# Patient Record
Sex: Male | Born: 1955
Health system: Southern US, Community
[De-identification: ages and names within clinical notes are randomized; demographics above are authoritative.]

## PROBLEM LIST (undated history)

## (undated) DIAGNOSIS — J342 Deviated nasal septum: Secondary | ICD-10-CM

## (undated) DIAGNOSIS — M5030 Other cervical disc degeneration, unspecified cervical region: Secondary | ICD-10-CM

## (undated) DIAGNOSIS — G4730 Sleep apnea, unspecified: Secondary | ICD-10-CM

## (undated) DIAGNOSIS — F419 Anxiety disorder, unspecified: Secondary | ICD-10-CM

## (undated) DIAGNOSIS — M5136 Other intervertebral disc degeneration, lumbar region: Secondary | ICD-10-CM

## (undated) DIAGNOSIS — C61 Malignant neoplasm of prostate: Secondary | ICD-10-CM

## (undated) DIAGNOSIS — E119 Type 2 diabetes mellitus without complications: Secondary | ICD-10-CM

## (undated) DIAGNOSIS — J45909 Unspecified asthma, uncomplicated: Secondary | ICD-10-CM

## (undated) DIAGNOSIS — E7800 Pure hypercholesterolemia, unspecified: Secondary | ICD-10-CM

## (undated) DIAGNOSIS — J449 Chronic obstructive pulmonary disease, unspecified: Secondary | ICD-10-CM

## (undated) DIAGNOSIS — N400 Enlarged prostate without lower urinary tract symptoms: Secondary | ICD-10-CM

## (undated) DIAGNOSIS — K219 Gastro-esophageal reflux disease without esophagitis: Secondary | ICD-10-CM

## (undated) DIAGNOSIS — F329 Major depressive disorder, single episode, unspecified: Secondary | ICD-10-CM

## (undated) DIAGNOSIS — F32A Depression, unspecified: Secondary | ICD-10-CM

## (undated) DIAGNOSIS — M51369 Other intervertebral disc degeneration, lumbar region without mention of lumbar back pain or lower extremity pain: Secondary | ICD-10-CM

## (undated) DIAGNOSIS — S92309A Fracture of unspecified metatarsal bone(s), unspecified foot, initial encounter for closed fracture: Secondary | ICD-10-CM

## (undated) DIAGNOSIS — H269 Unspecified cataract: Secondary | ICD-10-CM

## (undated) DIAGNOSIS — I10 Essential (primary) hypertension: Secondary | ICD-10-CM

## (undated) HISTORY — DX: Anxiety disorder, unspecified: F41.9

## (undated) HISTORY — DX: Sleep apnea, unspecified: G47.30

---

## 2003-09-25 HISTORY — PX: CERVICAL FUSION: SHX112

## 2004-05-11 IMAGING — CR DG CHEST 2V
2 series · 2 of 2 positions shown · non-contrast
Comparison: None.

CLINICAL DATA: Cervical foraminal stenosis; preoperative respiratory evaluation prior to decompression.  Long-time smoker with history of asthma. 
 CHEST, TWO VIEWS 05/11/04

[view not recorded (1 of 2)]
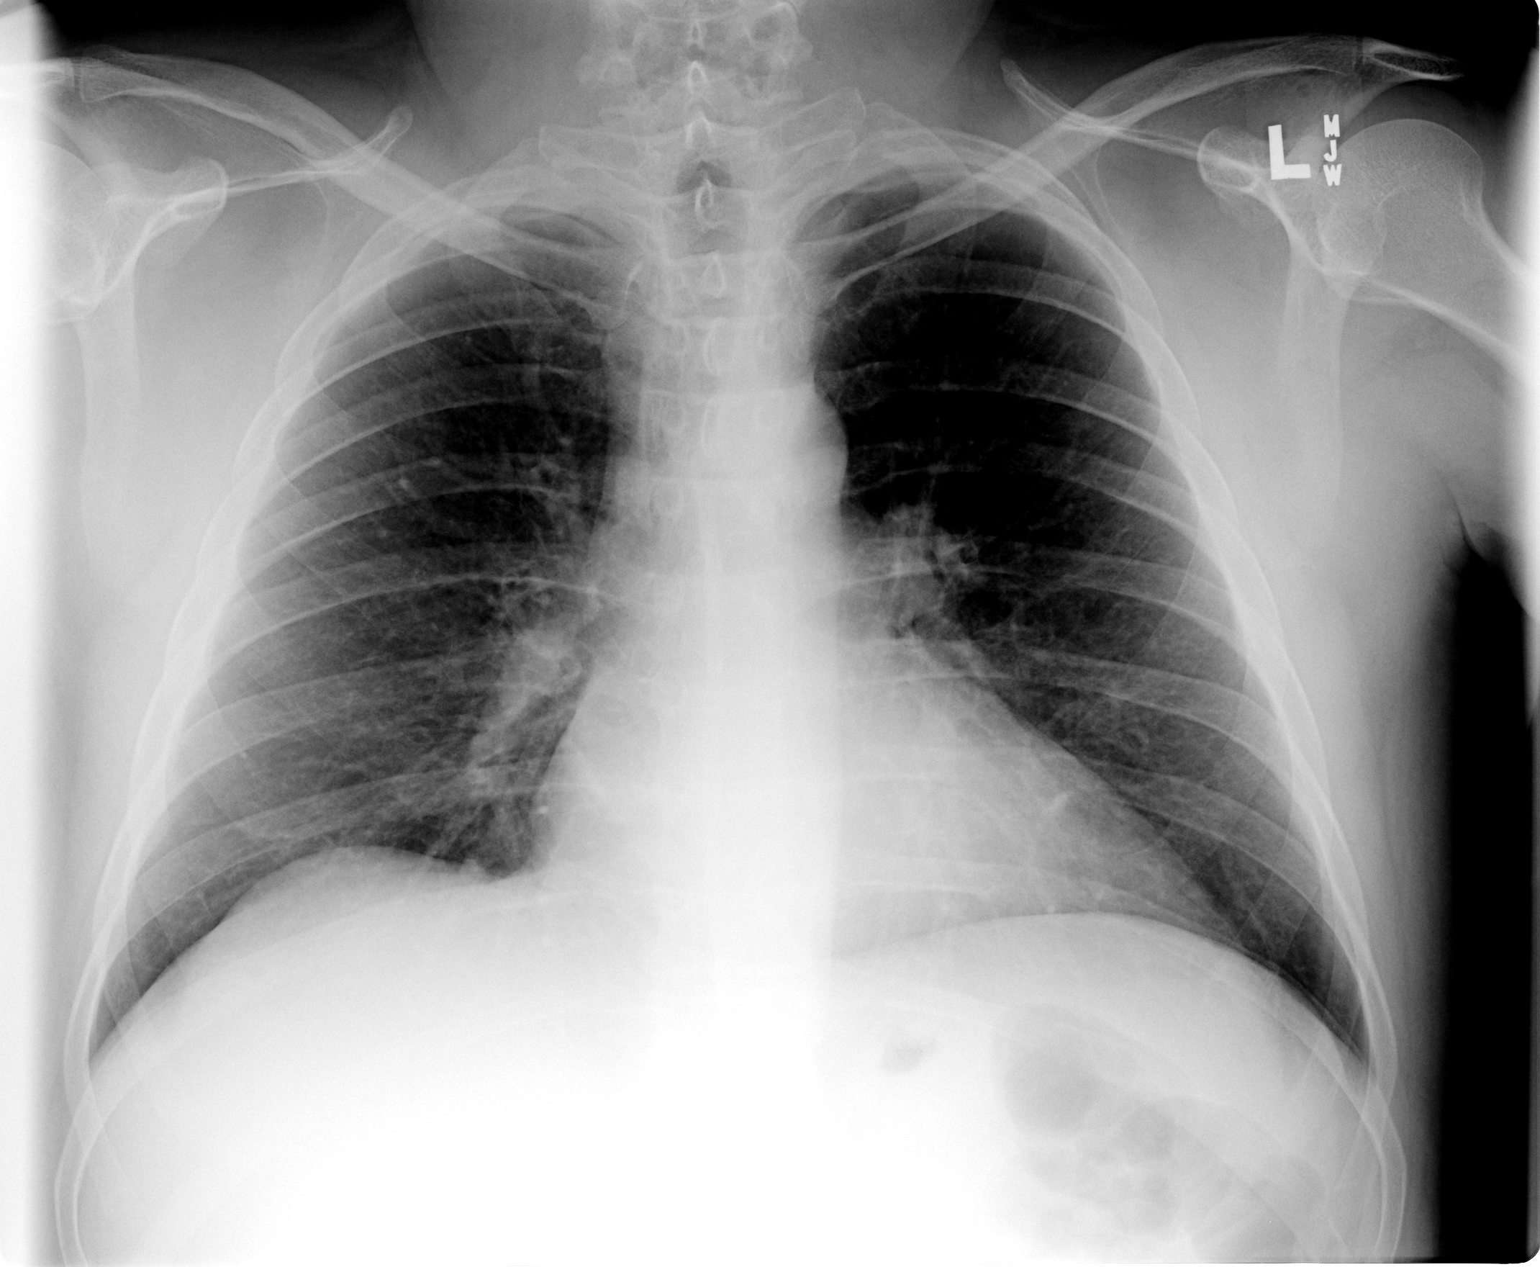

[view not recorded (2 of 2)]
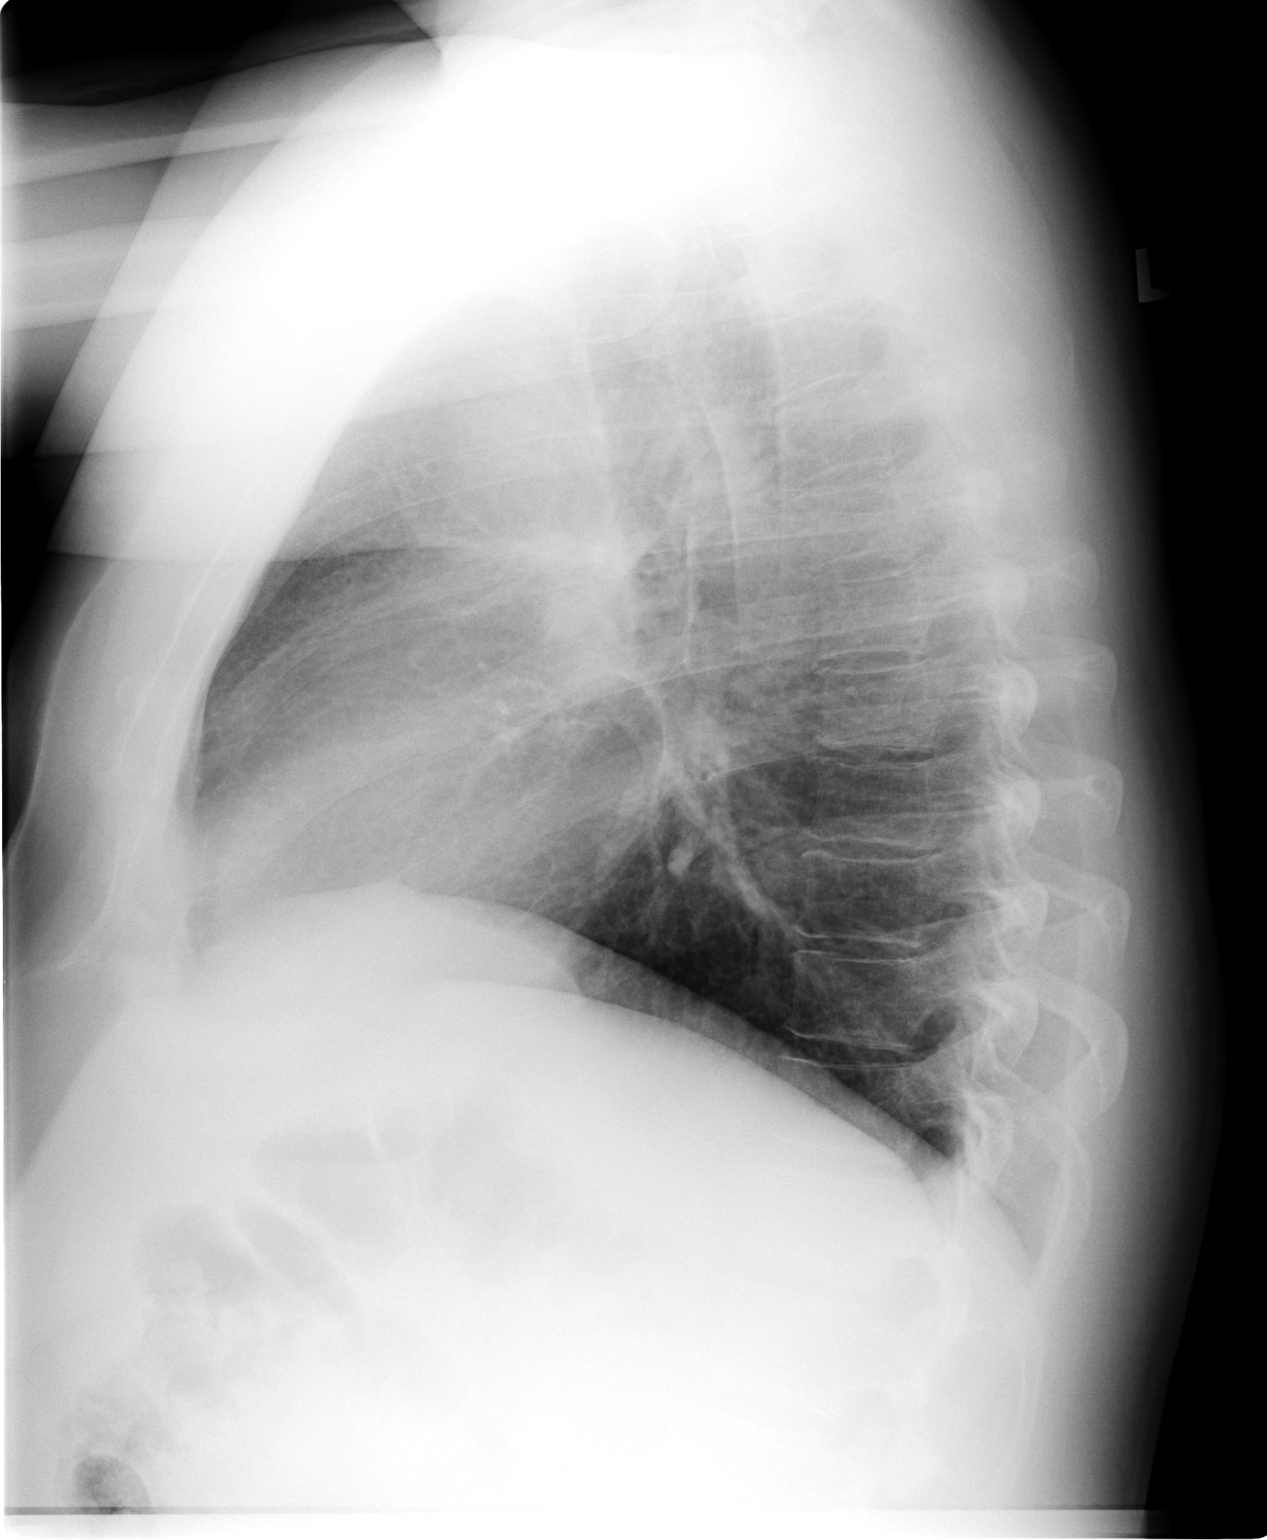

[2 of 2 positions shown; findings below may reference images not displayed]

FINDINGS: The cardiomediastinal silhouette is unremarkable for age.  The bronchovascular markings are mildly prominent with central peribronchial thickening in a pattern that is consistent with asthma or chronic bronchitis.  The lungs appear clear otherwise.  Minimal degenerative changes are present in the thoracic spine.  
 IMPRESSION
 Mild changes of chronic bronchitis or asthma.  No evidence of acute disease.

## 2004-05-16 ENCOUNTER — Inpatient Hospital Stay (HOSPITAL_COMMUNITY): Admission: RE | Admit: 2004-05-16 | Discharge: 2004-05-18 | Payer: Self-pay | Admitting: Specialist

## 2004-05-17 IMAGING — CR DG ABDOMEN 1V
1 series · 1 of 1 positions shown · non-contrast
Comparison: None.

CLINICAL DATA: C4-5 ACDF 05/16/04; abdominal pain and distention. 
 ABDOMEN ? AP VIEW, 05/17/04

[view not recorded]
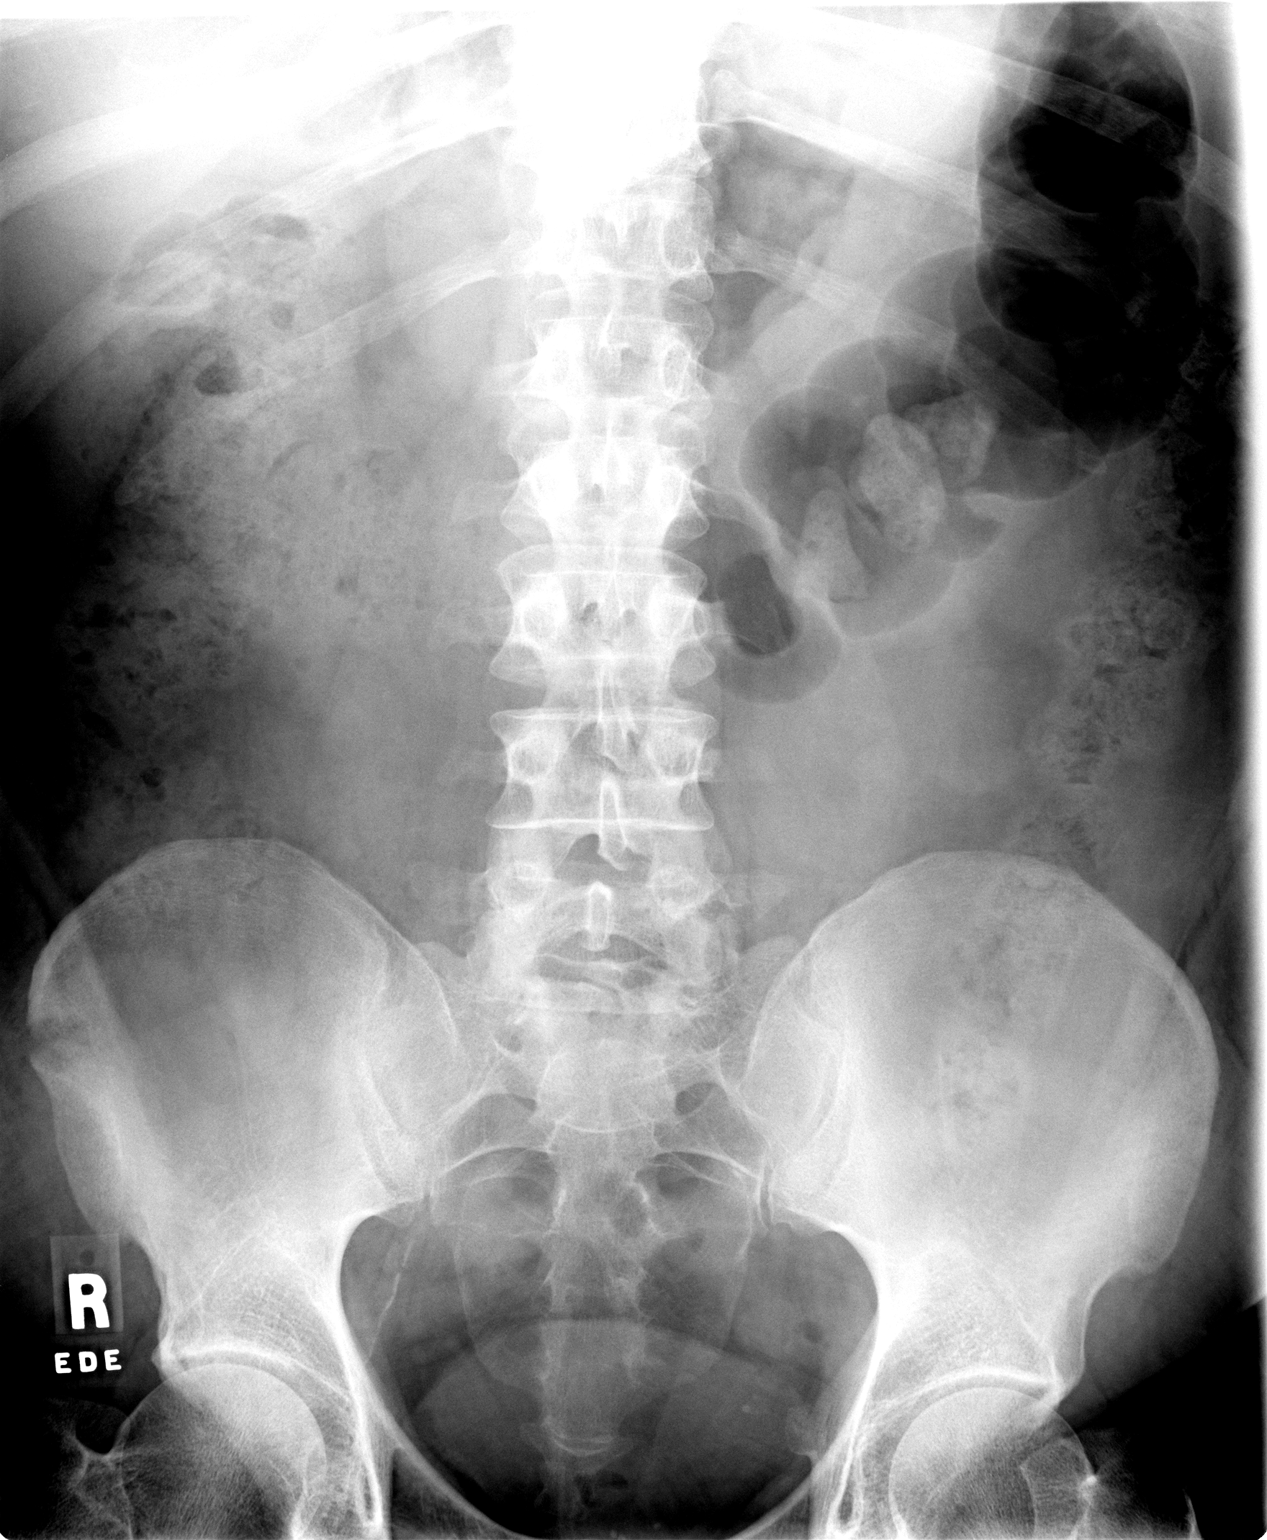

[1 of 1 positions shown; findings below may reference images not displayed]

FINDINGS: The bowel gas pattern is unremarkable and there is no evidence of obstruction or free air.  There is a large amount of stool present throughout the colon.  No urinary tract calculi are identified.  Right iliac atherosclerotic calcification is noted. 
 IMPRESSION
 No acute abdominal abnormality apart from possible constipation.

## 2005-08-27 ENCOUNTER — Inpatient Hospital Stay (HOSPITAL_COMMUNITY): Admission: RE | Admit: 2005-08-27 | Discharge: 2005-08-29 | Payer: Self-pay | Admitting: Specialist

## 2012-09-24 HISTORY — PX: PROSTATE BIOPSY: SHX241

## 2013-07-26 ENCOUNTER — Emergency Department (HOSPITAL_COMMUNITY)
Admission: EM | Admit: 2013-07-26 | Discharge: 2013-07-26 | Disposition: A | Discharge status: Home / Self Care | Payer: BC Managed Care – PPO | Attending: Emergency Medicine | Admitting: Emergency Medicine

## 2013-07-26 ENCOUNTER — Encounter (HOSPITAL_COMMUNITY): Payer: Self-pay | Admitting: Emergency Medicine

## 2013-07-26 ENCOUNTER — Emergency Department (HOSPITAL_COMMUNITY): Payer: BC Managed Care – PPO

## 2013-07-26 DIAGNOSIS — F17200 Nicotine dependence, unspecified, uncomplicated: Secondary | ICD-10-CM | POA: Insufficient documentation

## 2013-07-26 DIAGNOSIS — M549 Dorsalgia, unspecified: Secondary | ICD-10-CM

## 2013-07-26 DIAGNOSIS — Y9389 Activity, other specified: Secondary | ICD-10-CM | POA: Insufficient documentation

## 2013-07-26 DIAGNOSIS — J4489 Other specified chronic obstructive pulmonary disease: Secondary | ICD-10-CM | POA: Insufficient documentation

## 2013-07-26 DIAGNOSIS — Z8719 Personal history of other diseases of the digestive system: Secondary | ICD-10-CM | POA: Insufficient documentation

## 2013-07-26 DIAGNOSIS — W010XXA Fall on same level from slipping, tripping and stumbling without subsequent striking against object, initial encounter: Secondary | ICD-10-CM | POA: Insufficient documentation

## 2013-07-26 DIAGNOSIS — Y9289 Other specified places as the place of occurrence of the external cause: Secondary | ICD-10-CM | POA: Insufficient documentation

## 2013-07-26 DIAGNOSIS — J449 Chronic obstructive pulmonary disease, unspecified: Secondary | ICD-10-CM | POA: Insufficient documentation

## 2013-07-26 DIAGNOSIS — I10 Essential (primary) hypertension: Secondary | ICD-10-CM | POA: Insufficient documentation

## 2013-07-26 DIAGNOSIS — W19XXXA Unspecified fall, initial encounter: Secondary | ICD-10-CM

## 2013-07-26 DIAGNOSIS — Z8659 Personal history of other mental and behavioral disorders: Secondary | ICD-10-CM | POA: Insufficient documentation

## 2013-07-26 DIAGNOSIS — IMO0002 Reserved for concepts with insufficient information to code with codable children: Secondary | ICD-10-CM | POA: Insufficient documentation

## 2013-07-26 HISTORY — DX: Essential (primary) hypertension: I10

## 2013-07-26 HISTORY — DX: Unspecified asthma, uncomplicated: J45.909

## 2013-07-26 HISTORY — DX: Depression, unspecified: F32.A

## 2013-07-26 HISTORY — DX: Chronic obstructive pulmonary disease, unspecified: J44.9

## 2013-07-26 HISTORY — DX: Major depressive disorder, single episode, unspecified: F32.9

## 2013-07-26 HISTORY — DX: Gastro-esophageal reflux disease without esophagitis: K21.9

## 2013-07-26 IMAGING — CR DG LUMBAR SPINE COMPLETE 4+V
6 series · 6 of 6 positions shown · non-contrast
Comparison: None.

CLINICAL DATA: Traumatic injury with pain

EXAM:
LUMBAR SPINE - COMPLETE 4+ VIEW

[t lumbar spine ap]
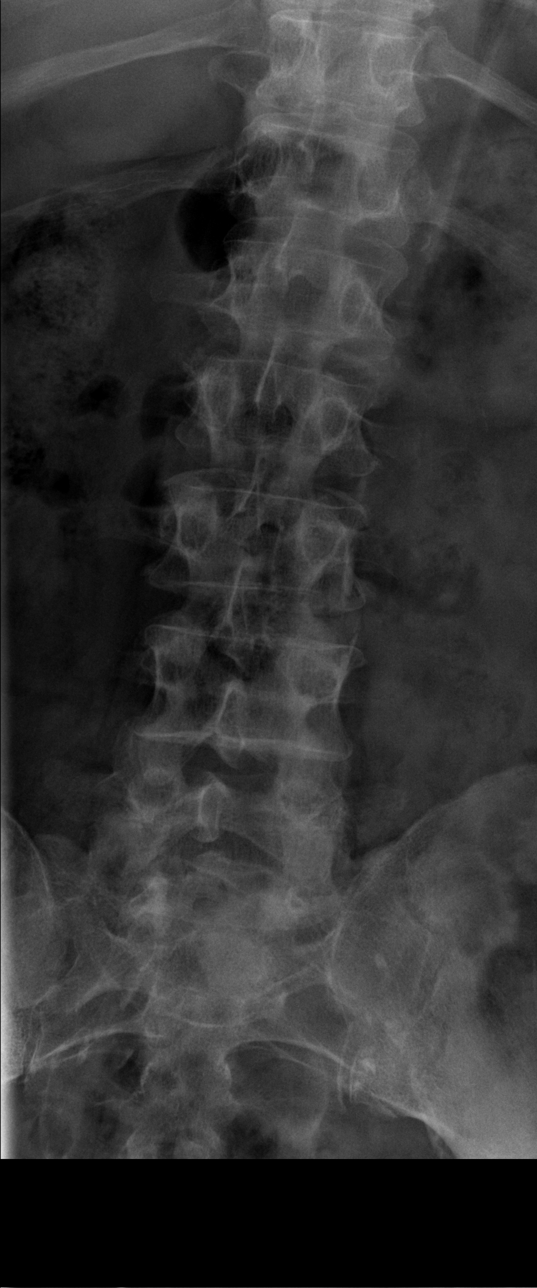

[t lumbar spine obl (1 of 3)]
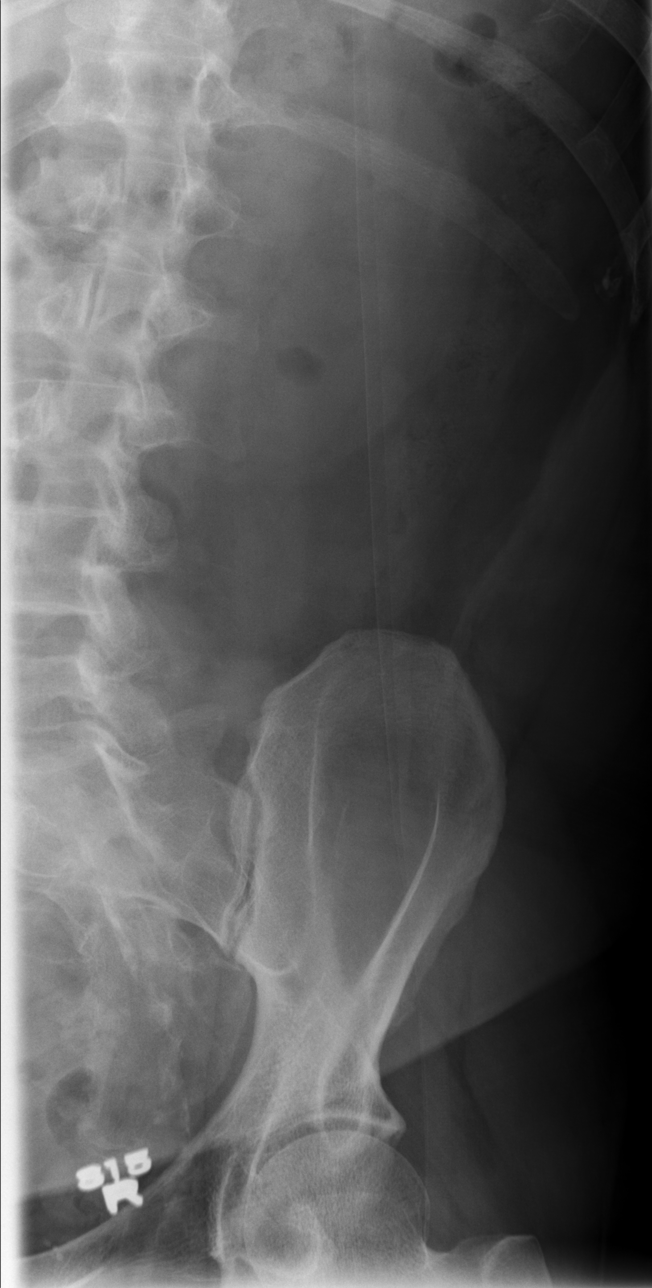

[t lumbar spine obl (2 of 3)]
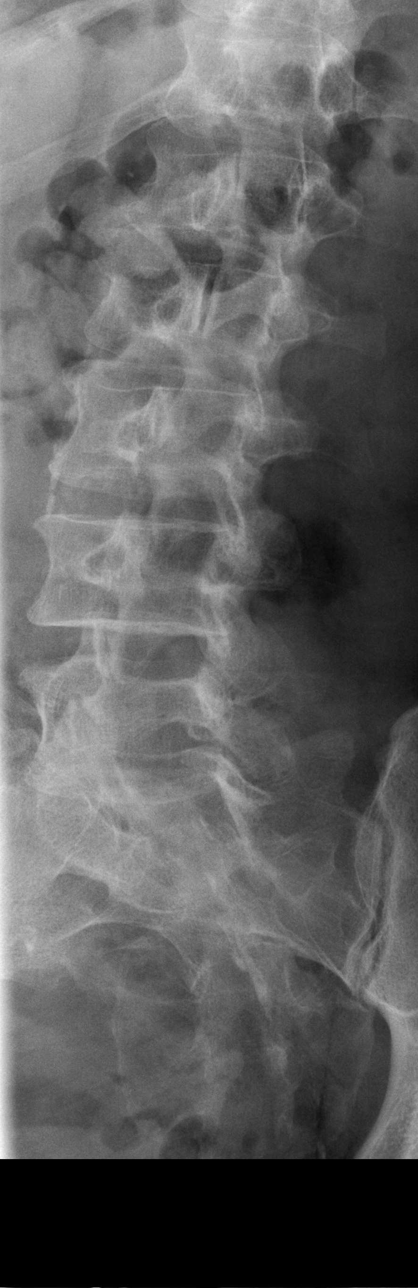

[t lumbar spine obl (3 of 3)]
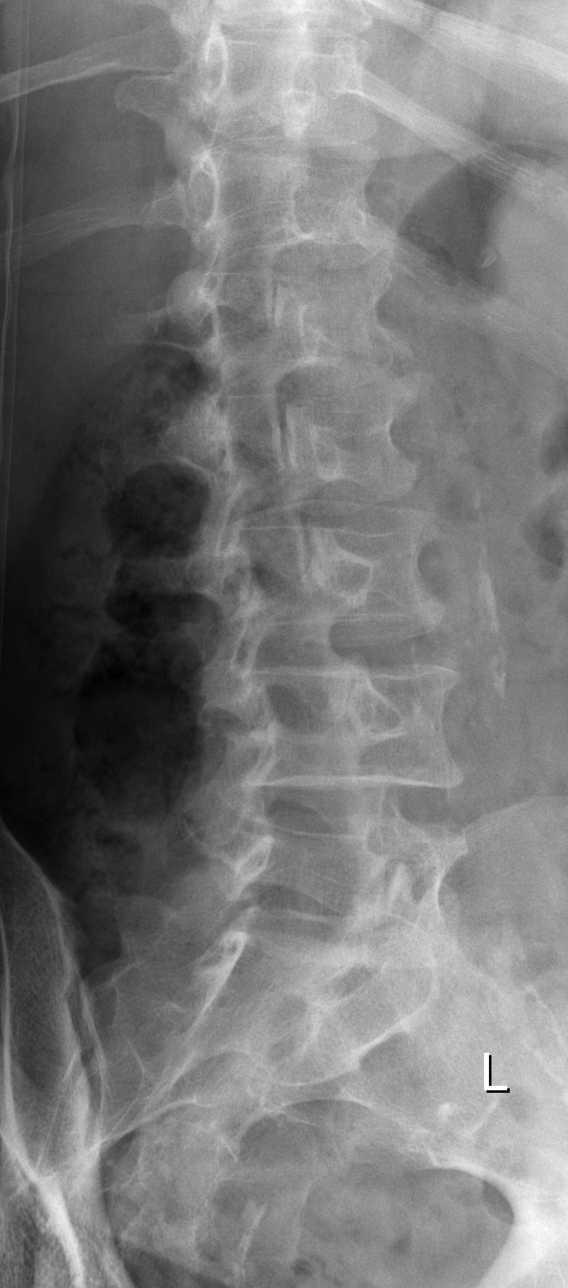

[t lumbar spine lat]
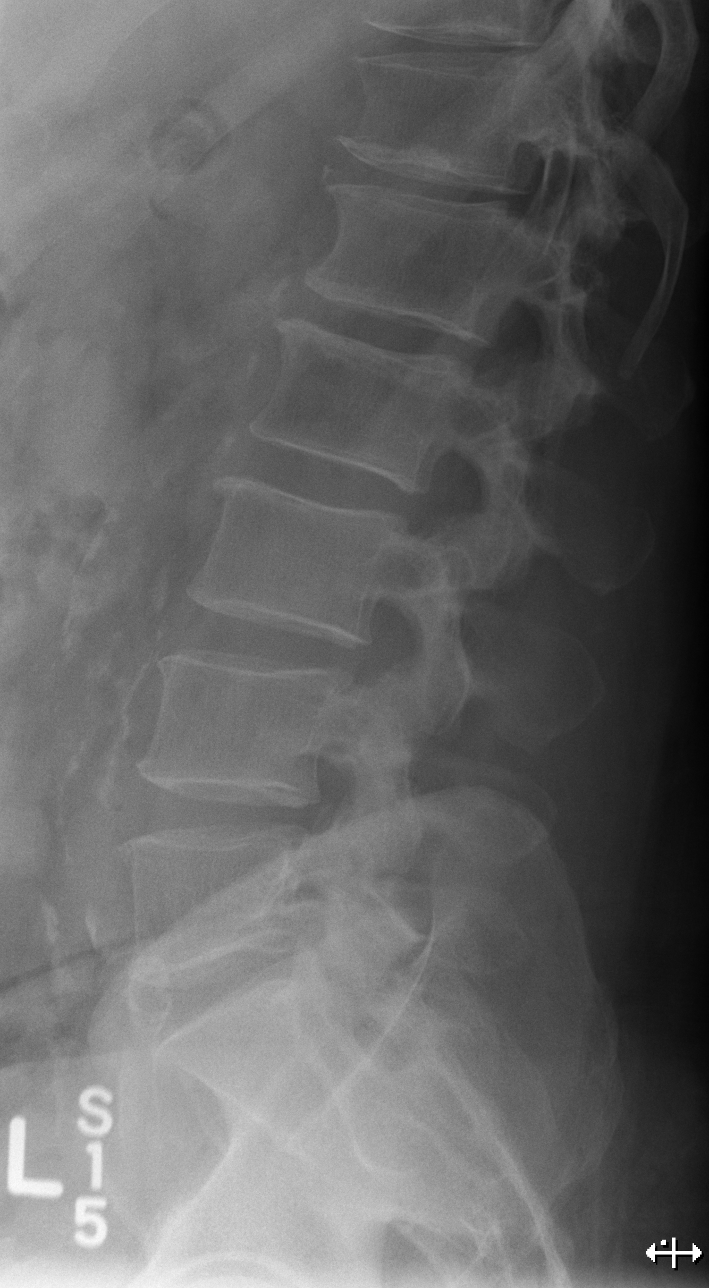

[t lumbar l-5 s-1 spot]
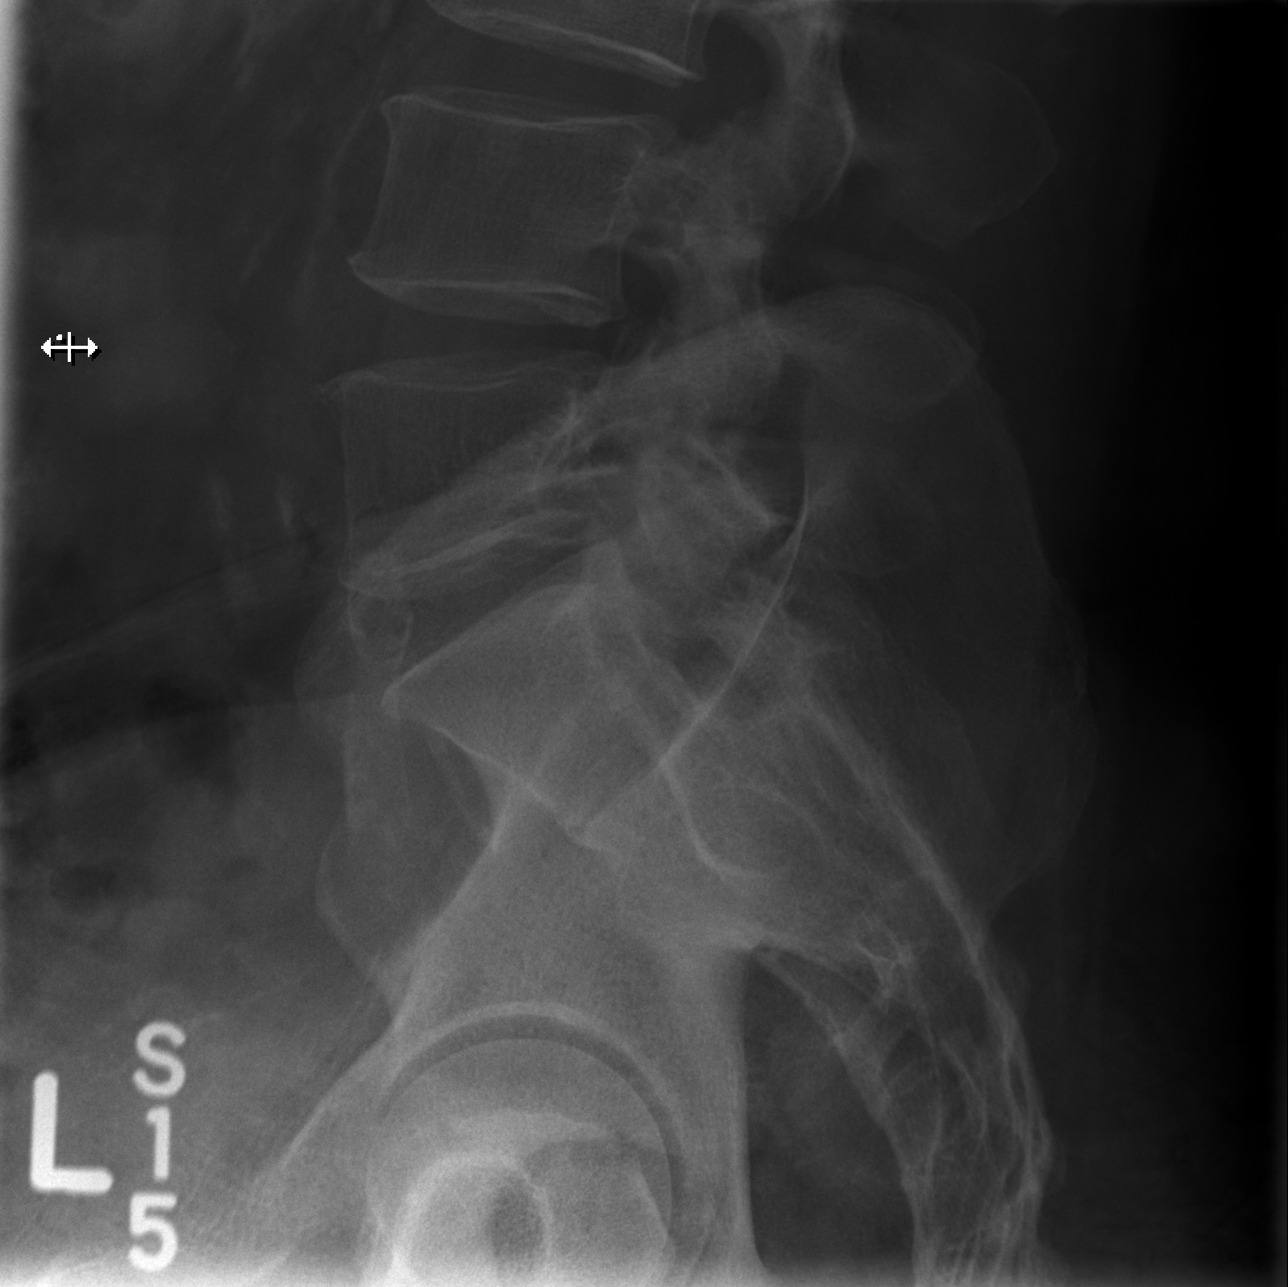

[6 of 6 positions shown; findings below may reference images not displayed]

FINDINGS: Five lumbar type vertebral bodies are well visualized. Vertebral
body height is well maintained. Osteophytic changes are noted. No
spondylolysis or spondylolisthesis is noted.
IMPRESSION: Mild degenerative change without acute abnormality.

## 2013-07-26 IMAGING — CR DG SACRUM/COCCYX 2+V
3 series · 3 of 3 positions shown · non-contrast
Comparison: None.

CLINICAL DATA: Recent traumatic injury with pain

EXAM:
SACRUM AND COCCYX - 2+ VIEW

[t sacrum coccyx lat]
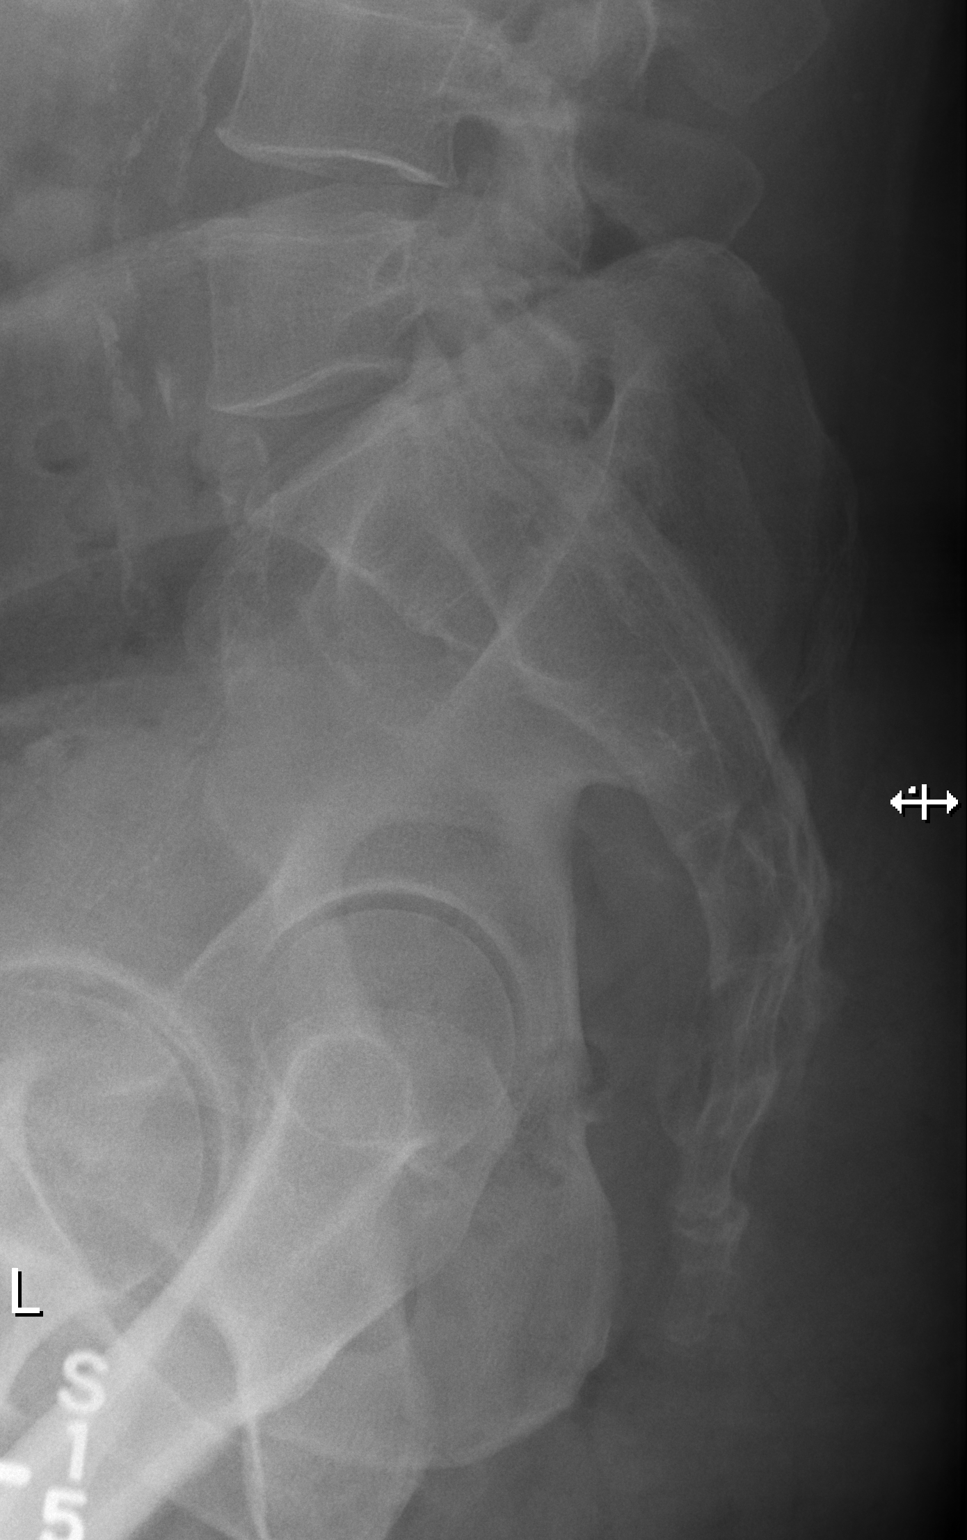

[t sacrum ap]
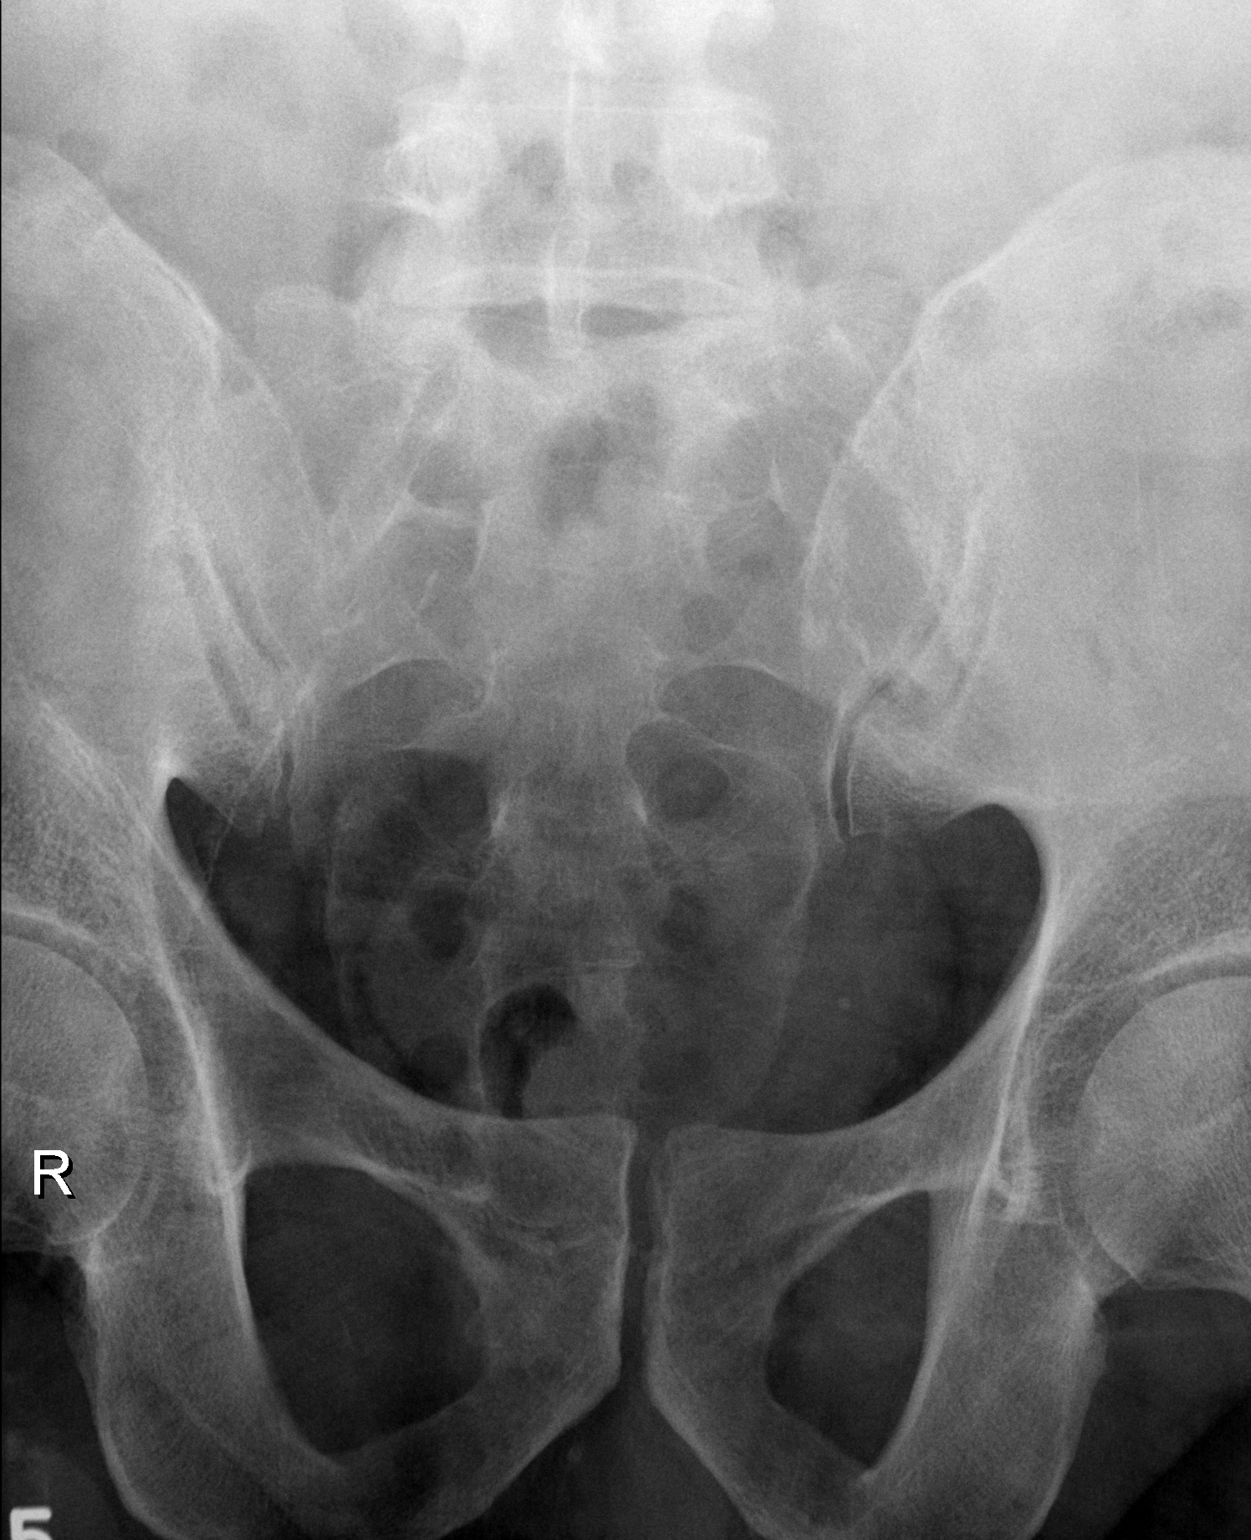

[t coccyx ap]
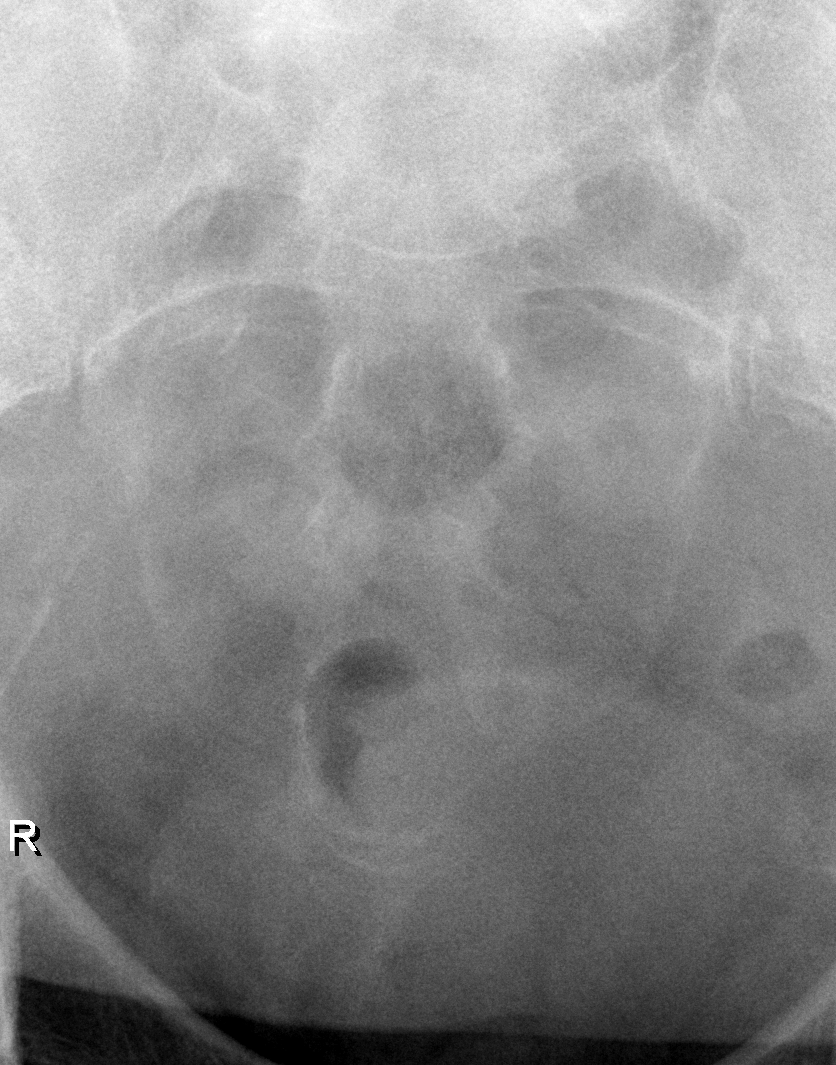

[3 of 3 positions shown; findings below may reference images not displayed]

FINDINGS: There is no evidence of fracture or other focal bone lesions
IMPRESSION: Negative.

## 2013-07-26 IMAGING — CR DG CERVICAL SPINE COMPLETE 4+V
7 series · 7 of 7 positions shown · non-contrast
Comparison: None.

CLINICAL DATA: Recent traumatic injury with pain

EXAM:
CERVICAL SPINE  4+ VIEWS

[w cervical spine lat]
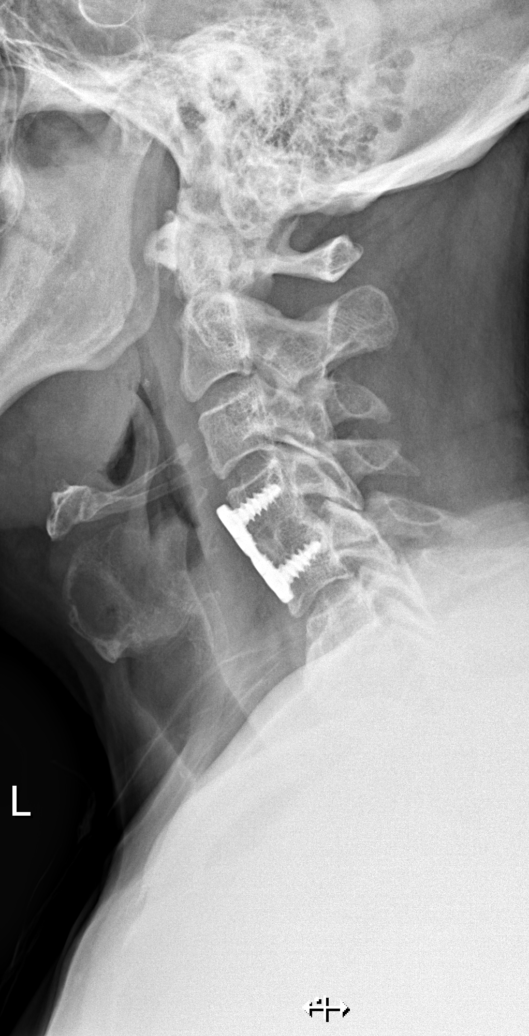

[w cervical swimmers]
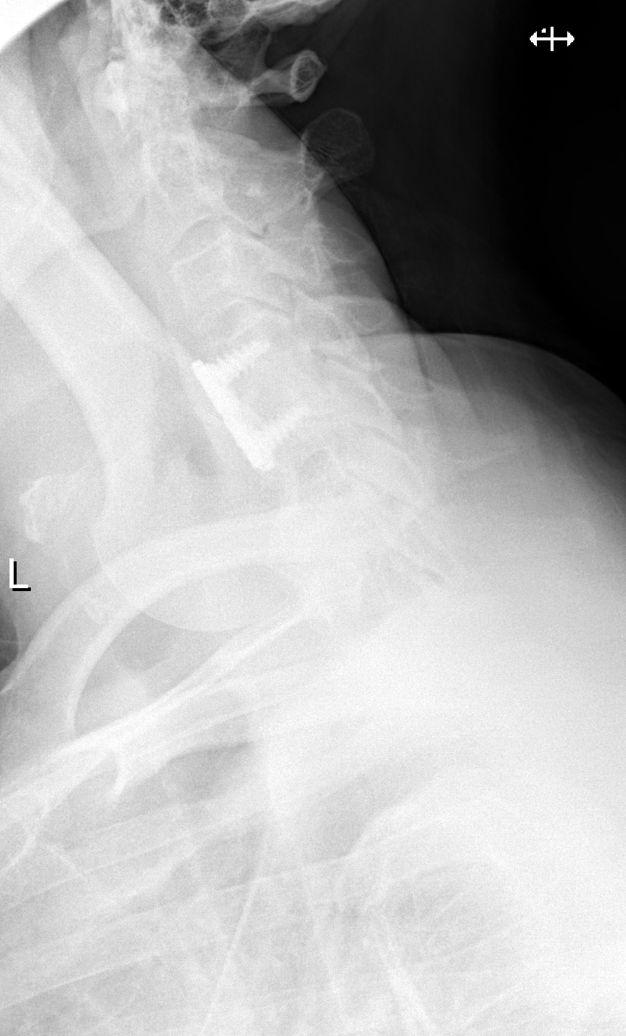

[w cervical spine ap_obl (1 of 2)]
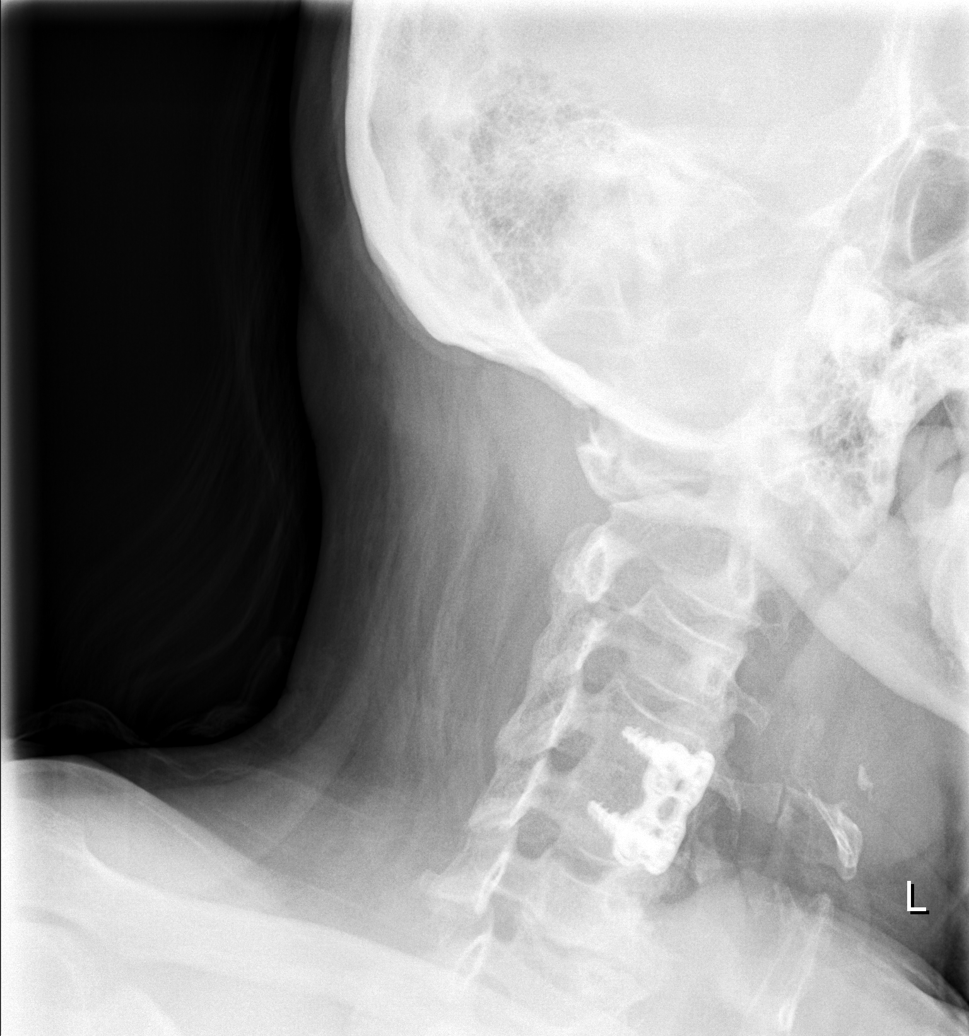

[w cervical spine ap_obl (2 of 2)]
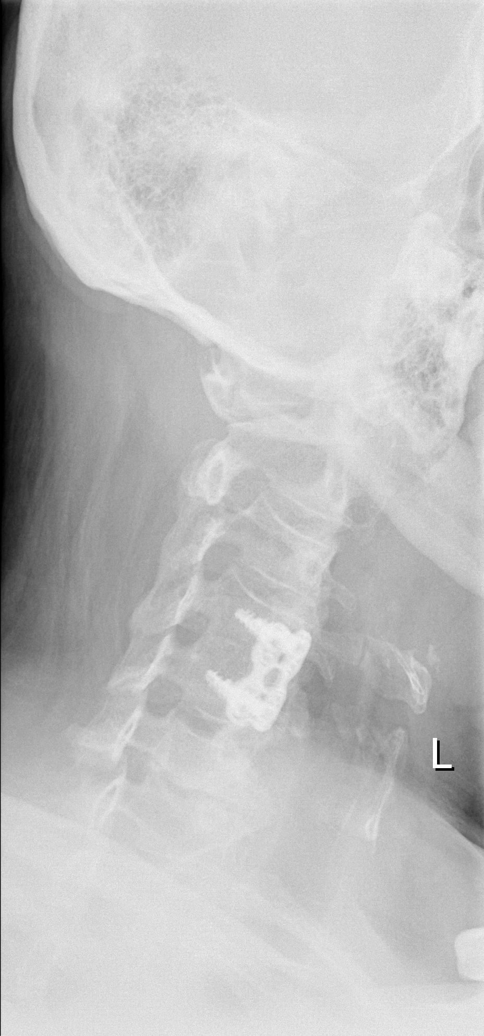

[w cervical spine ap (1 of 2)]
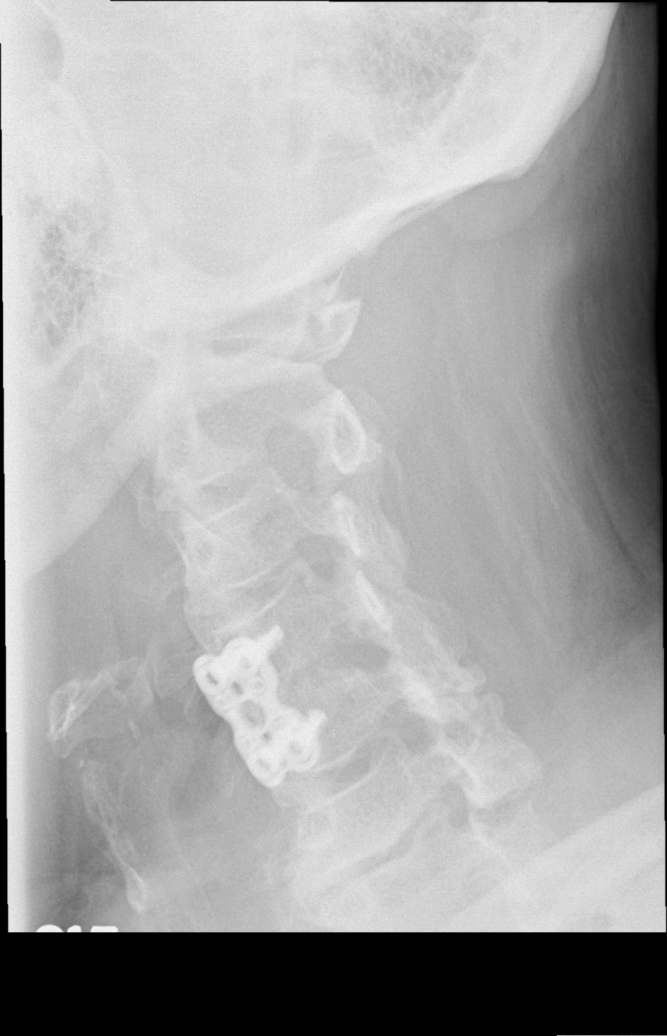

[w cervical spine ap (2 of 2)]
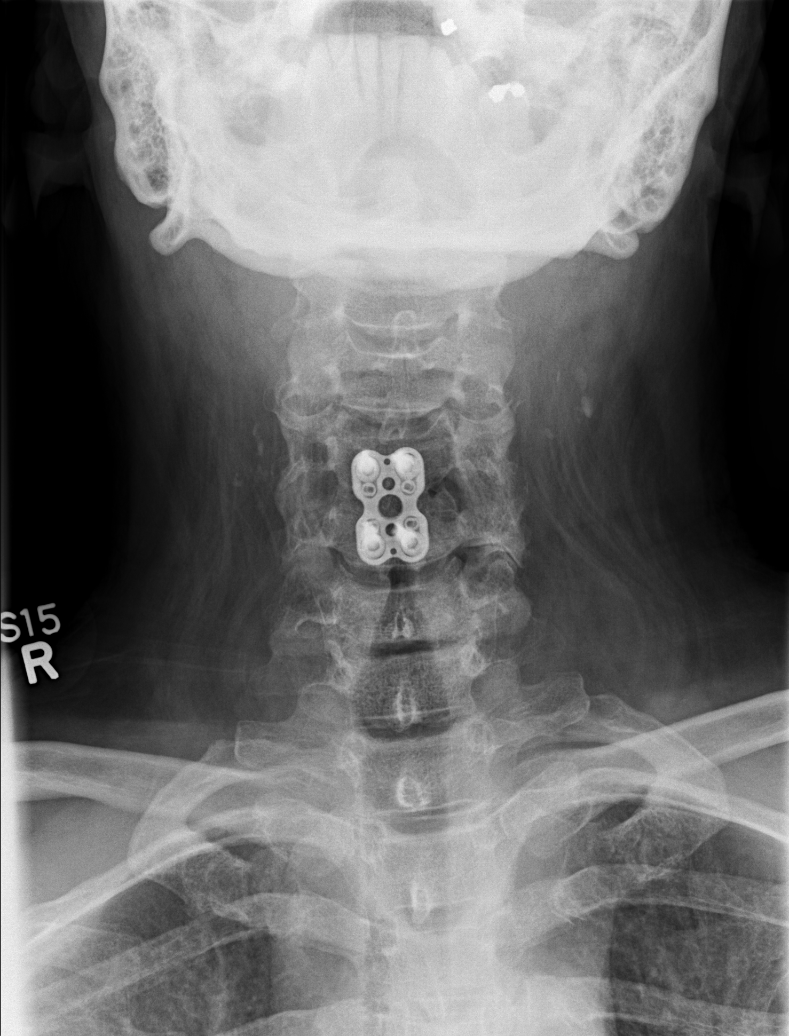

[w cervical spine odontoid]
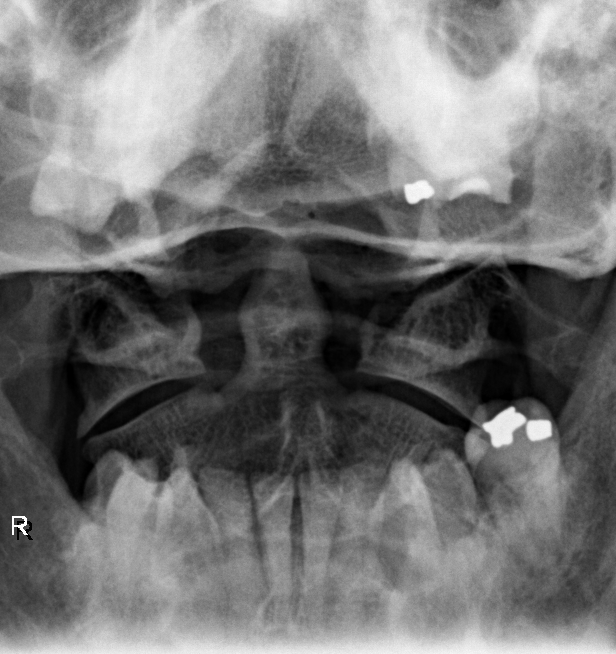

[7 of 7 positions shown; findings below may reference images not displayed]

FINDINGS: Postsurgical changes are noted with fusion and anterior fixation at
C4-5. No significant neural foraminal changes are seen. No
prevertebral soft tissue swelling is noted. Bilateral carotid
calcifications are seen. No acute fracture or acute facet
abnormality is noted.
IMPRESSION: Postsurgical changes. No acute abnormality noted.

## 2013-07-26 MED ORDER — DIAZEPAM 5 MG PO TABS
5.0000 mg | ORAL_TABLET | Freq: Once | ORAL | Status: AC
  Administered 2013-07-26: 5 mg via ORAL
  Filled 2013-07-26: qty 1

## 2013-07-26 MED ORDER — OXYCODONE-ACETAMINOPHEN 5-325 MG PO TABS: 2.0000 | ORAL_TABLET | Freq: Four times a day (QID) | ORAL | Status: DC | PRN

## 2013-07-26 MED ORDER — HYDROMORPHONE HCL PF 1 MG/ML IJ SOLN
1.0000 mg | Freq: Once | INTRAMUSCULAR | Status: AC
  Administered 2013-07-26: 1 mg via INTRAMUSCULAR
  Filled 2013-07-26: qty 1

## 2013-07-26 MED ORDER — KETOROLAC TROMETHAMINE 60 MG/2ML IM SOLN
60.0000 mg | Freq: Once | INTRAMUSCULAR | Status: AC
  Administered 2013-07-26: 60 mg via INTRAMUSCULAR
  Filled 2013-07-26: qty 2

## 2013-07-26 MED ORDER — METHOCARBAMOL 500 MG PO TABS: 500.0000 mg | ORAL_TABLET | Freq: Two times a day (BID) | ORAL | Status: DC

## 2013-07-26 NOTE — ED Notes (Signed)
Radiology to transport pt, pt requesting pain meds before x-rays.

## 2013-07-26 NOTE — ED Notes (Signed)
Fell Friday night and hurt rt side and butt feet slipped out from under him hx of neck surgery

## 2013-07-26 NOTE — ED Provider Notes (Signed)
CSN: [id number]     Arrival date & time 07/26/13  1212 History   First MD Initiated Contact with Patient 07/26/13 1217     No chief complaint on file.  (Consider location/radiation/quality/duration/timing/severity/associated sxs/prior Treatment) HPI Comments: Patient presents emergency department with chief complaint of low back pain. He states that he suffered a mechanical fall on Friday night. States that he slipped on wet grass. He landed on his buttocks. States that he has had persistent pain since the fall. States that his pain is moderate to severe. He has not tried taking anything to alleviate his symptoms. He denies bowel or bladder incontinence. Denies saddle anesthesia. He states that he had a recent prostate biopsy, and that he has had some hematuria following that procedure, but this is been improving. Denies any kidney problems.  The history is provided by the patient. No language interpreter was used.    Past Medical History  Diagnosis Date  . Asthma   . Depression   . Hypertension   . Acid reflux   . COPD (chronic obstructive pulmonary disease)    History reviewed. No pertinent past surgical history. No family history on file. History  Substance Use Topics  . Smoking status: Current Every Day Smoker  . Smokeless tobacco: Not on file  . Alcohol Use: Yes    Review of Systems  All other systems reviewed and are negative.    Allergies  Review of patient's allergies indicates no known allergies.  Home Medications  No current outpatient prescriptions on file. BP 144/79  Pulse 90  Temp(Src) 97.7 F (36.5 C) (Oral)  Resp 24  SpO2 95% Physical Exam  Nursing note and vitals reviewed. Constitutional: He is oriented to person, place, and time. He appears well-developed and well-nourished. No distress.  HENT:  Head: Normocephalic and atraumatic.  Eyes: Conjunctivae and EOM are normal. Right eye exhibits no discharge. Left eye exhibits no discharge. No scleral  icterus.  Neck: Normal range of motion. Neck supple. No tracheal deviation present.  Cardiovascular: Normal rate, regular rhythm and normal heart sounds.  Exam reveals no gallop and no friction rub.   No murmur heard. Pulmonary/Chest: Effort normal and breath sounds normal. No respiratory distress. He has no wheezes.  Abdominal: Soft. He exhibits no distension. There is no tenderness.  Musculoskeletal: Normal range of motion.  Left sided lumbar paraspinal muscles tender to palpation, no bony tenderness, step-offs, or gross abnormality or deformity of spine, patient is able to ambulate, moves all extremities  Bilateral great toe extension intact Bilateral plantar/dorsiflexion intact  Neurological: He is alert and oriented to person, place, and time.  Sensation and strength intact bilaterally   Skin: Skin is warm. He is not diaphoretic.  Psychiatric: He has a normal mood and affect. His behavior is normal. Judgment and thought content normal.    ED Course  Procedures (including critical care time) No results found for this or any previous visit. Dg Cervical Spine Complete  07/26/2013   CLINICAL DATA:  Recent traumatic injury with pain  EXAM: CERVICAL SPINE  4+ VIEWS  COMPARISON:  None.  FINDINGS: Postsurgical changes are noted with fusion and anterior fixation at C4-5. No significant neural foraminal changes are seen. No prevertebral soft tissue swelling is noted. Bilateral carotid calcifications are seen. No acute fracture or acute facet abnormality is noted.  IMPRESSION: Postsurgical changes. No acute abnormality noted.   Electronically Signed   By: Alcide Clever M.D.   On: 07/26/2013 14:25   Dg Lumbar Spine Complete  07/26/2013   CLINICAL DATA:  Traumatic injury with pain  EXAM: LUMBAR SPINE - COMPLETE 4+ VIEW  COMPARISON:  None.  FINDINGS: Five lumbar type vertebral bodies are well visualized. Vertebral body height is well maintained. Osteophytic changes are noted. No spondylolysis or  spondylolisthesis is noted.  IMPRESSION: Mild degenerative change without acute abnormality.   Electronically Signed   By: Alcide Clever M.D.   On: 07/26/2013 14:24   Dg Sacrum/coccyx  07/26/2013   CLINICAL DATA:  Recent traumatic injury with pain  EXAM: SACRUM AND COCCYX - 2+ VIEW  COMPARISON:  None.  FINDINGS: There is no evidence of fracture or other focal bone lesions  IMPRESSION: Negative.   Electronically Signed   By: Alcide Clever M.D.   On: 07/26/2013 14:24     EKG Interpretation   None       MDM   1. Back pain   2. Fall, initial encounter     Patient with low back pain following a mechanical fall on Friday night. Will check plain films.  Will give some toradol and valium. No known renal impairment.   Patient with back pain.  No neurological deficits and normal neuro exam.  Patient can walk but states is painful.  No loss of bowel or bladder control.  No concern for cauda equina.  No fever, night sweats, weight loss, h/o cancer, IVDU.  RICE protocol and pain medicine indicated and discussed with patient.   Patient seen by and discussed with Dr. Denton Lank, who recommends giving 1 of dilaudid and discharge to home with pain meds.       Roxy Horseman, PA-C 07/26/13 1536

## 2013-07-30 NOTE — ED Provider Notes (Signed)
Medical screening examination/treatment/procedure(s) were conducted as a shared visit with non-physician practitioner(s) and myself.  I personally evaluated the patient during the encounter.  EKG Interpretation   None       Pt c/o low back pain s/p slip and fall. Spine nt. No numbness/weakness. xr.  Suzi Roots, MD 07/30/13 [phone]

## 2013-09-01 ENCOUNTER — Encounter: Payer: Self-pay | Admitting: Podiatrist

## 2013-09-01 ENCOUNTER — Ambulatory Visit (INDEPENDENT_AMBULATORY_CARE_PROVIDER_SITE_OTHER): Payer: BC Managed Care – PPO

## 2013-09-01 ENCOUNTER — Ambulatory Visit (INDEPENDENT_AMBULATORY_CARE_PROVIDER_SITE_OTHER): Payer: BC Managed Care – PPO | Admitting: Podiatrist

## 2013-09-01 VITALS — BP 128/82 | HR 96 | Resp 18

## 2013-09-01 DIAGNOSIS — M79609 Pain in unspecified limb: Secondary | ICD-10-CM

## 2013-09-01 DIAGNOSIS — M722 Plantar fascial fibromatosis: Secondary | ICD-10-CM

## 2013-09-01 DIAGNOSIS — B351 Tinea unguium: Secondary | ICD-10-CM

## 2013-09-01 DIAGNOSIS — B353 Tinea pedis: Secondary | ICD-10-CM

## 2013-09-01 MED ORDER — NAFTIFINE HCL 2 % EX CREA: 1.0000 "application " | TOPICAL_CREAM | CUTANEOUS | Status: DC

## 2013-09-01 MED ORDER — TRIAMCINOLONE ACETONIDE 40 MG/ML IJ SUSP
20.0000 mg | Freq: Once | INTRAMUSCULAR | Status: AC
  Administered 2013-09-01: 20 mg

## 2013-09-01 MED ORDER — TERBINAFINE HCL 250 MG PO TABS: 250.0000 mg | ORAL_TABLET | Freq: Every day | ORAL | Status: DC

## 2013-09-01 NOTE — Progress Notes (Addendum)
MRN: [id number] Name: Charles Wells  Sex: male Age: 57 y.o. DOB: 1956/03/31  Provider: Marlowe Aschoff P  Allergies: Shellfish allergy   Chief Complaint  Patient presents with  . Plantar Fasciitis    both heels on bottom but right mainly     HPI: Patient is 57 y.o. male who presents today for heel pain right greater than left. Patient relates pain with first step in the morning or after sitting for long periods of time since the middle of September. 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 He fell his primary care physician who tried an injection from the plantar aspect of his heel and states that he had minimal relief. He also has been experiencing leg cramps states that his feet breakout. He states he's been using the cream AmLactin.  Past Medical History  Diagnosis Date  . Asthma   . Depression   . Hypertension   . Acid reflux   . COPD (chronic obstructive pulmonary disease)        Medication List       This list is accurate as of: 09/01/13 11:46 AM.  Always use your most recent med list.               albuterol 108 (90 BASE) MCG/ACT inhaler  Commonly known as:  PROVENTIL HFA;VENTOLIN HFA  Inhale 2 puffs into the lungs every 6 (six) hours as needed for wheezing.     buPROPion 100 MG tablet  Commonly known as:  WELLBUTRIN  Take 100 mg by mouth 2 (two) times daily.     cholecalciferol 400 UNITS Tabs tablet  Commonly known as:  VITAMIN D  Take 50,000 Units by mouth. Take 1 capsule by mouth weekly for 8 weeks     clonazePAM 0.5 MG tablet  Commonly known as:  KLONOPIN  Take 0.5 mg by mouth 2 (two) times daily as needed for anxiety.     cyclobenzaprine 5 MG tablet  Commonly known as:  FLEXERIL  Take 5 mg by mouth 3 (three) times daily as needed for muscle spasms.     Fish Oil 1000 MG Caps  Take 1 capsule by mouth daily.     ibuprofen 200 MG tablet  Commonly known as:  ADVIL,MOTRIN  Take 200 mg by mouth every 6  (six) hours as needed.     loratadine 10 MG tablet  Commonly known as:  CLARITIN  Take 10 mg by mouth daily.     magnesium oxide 400 MG tablet  Commonly known as:  MAG-OX  Take 400 mg by mouth daily.     methocarbamol 500 MG tablet  Commonly known as:  ROBAXIN  Take 1 tablet (500 mg total) by mouth 2 (two) times daily.     montelukast 10 MG tablet  Commonly known as:  SINGULAIR  Take 10 mg by mouth at bedtime.     Naftifine HCl 2 % Crea  Commonly known as:  NAFTIN  Apply 1 application topically 1 day or 1 dose.     olmesartan-hydrochlorothiazide 20-12.5 MG per tablet  Commonly known as:  BENICAR HCT  Take 1 tablet by mouth daily.     omeprazole 20 MG capsule  Commonly known as:  PRILOSEC  Take 20 mg by mouth daily.     oxyCODONE-acetaminophen 5-325 MG per tablet  Commonly known as:  PERCOCET/ROXICET  Take 2 tablets by mouth every 6 (six) hours as needed for pain.  potassium chloride SA 20 MEQ tablet  Commonly known as:  K-DUR,KLOR-CON  Take 20 mEq by mouth 2 (two) times daily.     simvastatin 20 MG tablet  Commonly known as:  ZOCOR  Take 20 mg by mouth daily.     tadalafil 5 MG tablet  Commonly known as:  CIALIS  Take 5 mg by mouth daily.     terbinafine 250 MG tablet  Commonly known as:  LAMISIL  Take 1 tablet (250 mg total) by mouth daily.     tiotropium 18 MCG inhalation capsule  Commonly known as:  SPIRIVA  Place 18 mcg into inhaler and inhale daily.     VITAMIN B-12 PO  Take 1 tablet by mouth daily.     VITAMIN C PO  Take 1 tablet by mouth daily.         No past surgical history on file.     Review of Systems  DATA OBTAINED: from patient intake form GENERAL: Feels well no fevers, no fatigue, no changes in appetite SKIN: No itching, no rashes, no open lesions, no wounds EYES: No eye pain,no redness, no discharge EARS: No earache,no ringing of ears, no recent change in hearing NOSE: No congestion, no drainage, no bleeding   MOUTH/THROAT: No mouth pain, No sore throat, No difficulty chewing or swallowing  RESPIRATORY: No cough, no wheezing, no SOB CARDIAC: No chest pain,no heart palpitations,no new onset lower extremity edema  GI: No abdominal pain, No Nausea, no vomiting, no diarrhea, no heartburn or no reflux  GU: No dysuria, no increased frequency or urgency MUSCULOSKELETAL: No unrelieved bone/joint pain, positive leg cramps NEUROLOGIC: Awake, alert, appropriate to situation, No change in mental status. PSYCHIATRIC: No overt anxiety or sadness.No behavior issue.  AMBULATION:  Ambulates unassisted  Filed Vitals:   09/01/13 0902  BP: 128/82  Pulse: 96  Resp: 18    Physical Exam  GENERAL APPEARANCE: Alert, conversant. Appropriately groomed. No acute distress.  VASCULAR: Pedal pulses palpable and strong bilateral.  Capillary refill time is immediate to all digits,  Proximal to distal cooling it warm to warm.  Digital hair growth is present bilateral  NEUROLOGIC: sensation is intact epicritically and protectively to 5.07 monofilament at 5/5 sites bilateral.  Light touch is intact bilateral, vibratory sensation intact bilateral, achilles tendon reflex is intact bilateral.  MUSCULOSKELETAL: acceptable muscle strength, tone and stability bilateral.  Intrinsic muscluature intact bilateral.  Rectus appearance of foot and digits noted bilateral. Pain on palpation plantar medial aspect of the right greater than left heels. Negative tinel sign bilateral  DERMATOLOGIC: Some Brown lesions on the plantar aspect of bilateral feet are noted consistent with tinea pedis. Generalized xerosis is also present. Areas of thickening xerotic skin are also noted. No open lesions, no interdigital maceration present. Mycotic appearance of bilateral hallux nails is present with yellowish brownish color discoloration from distal to proximal.  Assessment  Plantar fasciitis, tinea pedis, mycotic hallux nails bilateral   Plan Injected  bilateral heels with Kenalog and Marcaine mixture, recommended power step inserts and these were dispensed for his use. Discussed that these are not beneficial would recommend custom orthotic devices. MOBIC was written for him as well. He was given instructions for use. Discussed the use of Lamisil for the hallux nails and will obtain a liver function panel. A written prescription was written for Lamisil however he was instructed not to take it until the results a liver function panel are back. Wrote a prescription for Naftin cream for the  tinea pedis. I see him back in 3-4 weeks for followup.  Marlowe Aschoff DPM    Nery Kalisz P, DPM

## 2013-09-01 NOTE — Patient Instructions (Addendum)
Plantar Fasciitis (Heel Spur Syndrome) with Rehab The plantar fascia is a fibrous, ligament-like, soft-tissue structure that spans the bottom of the foot. Plantar fasciitis is a condition that causes pain in the foot due to inflammation of the tissue. SYMPTOMS   Pain and tenderness on the underneath side of the foot.  Pain that worsens with standing or walking. CAUSES  Plantar fasciitis is caused by irritation and injury to the plantar fascia on the underneath side of the foot. Common mechanisms of injury include:  Direct trauma to bottom of the foot.  Damage to a small nerve that runs under the foot where the main fascia attaches to the heel bone. Stress placed on the plantar fascia due to any mild increased activity or injury RISK INCREASES WITH:   Obesity.  Poor strength and flexibility.  Improperly fitted shoes.  Tight calf muscles.  Flat feet.  Failure to warm-up properly before activity.  PREVENTION  Warm up and stretch properly before activity.  Strength, flexibility  Maintain a health body weight.  Avoid stress on the plantar fascia.  Wear properly fitted shoes, including arch supports for individuals who have flat feet. PROGNOSIS  If treated properly, then the symptoms of plantar fasciitis usually resolve without surgery. However, occasionally surgery is necessary. RELATED COMPLICATIONS   Recurrent symptoms that may result in a chronic condition.  Problems of the lower back that are caused by compensating for the injury, such as limping.  Pain or weakness of the foot during push-off following surgery.  Chronic inflammation, scarring, and partial or complete fascia tear, occurring more often from repeated injections. TREATMENT  Treatment initially involves the use of ice and medication to help reduce pain and inflammation. The use of strengthening and stretching exercises may help reduce pain with activity, especially stretches of the Achilles tendon.  Your  caregiver may recommend that you use arch supports to help reduce stress on the plantar fascia. Often, corticosteroid injections are given to reduce inflammation. If symptoms persist for greater than 6 months despite non-surgical (conservative), then surgery may be recommended.  MEDICATION   If pain medication is necessary, then nonsteroidal anti-inflammatory medications, such as aspirin and ibuprofen, or other minor pain relievers, such as acetaminophen, are often recommended. Corticosteroid injections may be given by your caregiver.  HEAT AND COLD  Cold treatment (icing) relieves pain and reduces inflammation. Cold treatment should be applied for 10 to 15 minutes every 2 to 3 hours for inflammation and pain and immediately after any activity that aggravates your symptoms. Use ice packs or massage the area with a piece of ice (ice massage).  Heat treatment may be used prior to performing the stretching and strengthening activities prescribed by your caregiver, physical therapist, or athletic trainer. Use a heat pack or soak the injury in warm water. SEEK IMMEDIATE MEDICAL CARE IF:  Treatment seems to offer no benefit, or the condition worsens.  Any medications produce adverse side effects.    EXERCISES-- perform each exercise a total of 10-15 repetitions.  Hold for 30 seconds and perform 3 times per day   RANGE OF MOTION (ROM) AND STRETCHING EXERCISES - Plantar Fasciitis (Heel Spur Syndrome) These exercises may help you when beginning to rehabilitate your injury.   While completing these exercises, remember:   Restoring tissue flexibility helps normal motion to return to the joints. This allows healthier, less painful movement and activity.  An effective stretch should be held for at least 30 seconds.  A stretch should never be painful. You   should only feel a gentle lengthening or release in the stretched tissue. RANGE OF MOTION - Toe Extension, Flexion  Sit with your right / left  leg crossed over your opposite knee.  Grasp your toes and gently pull them back toward the top of your foot. You should feel a stretch on the bottom of your toes and/or foot.  Hold this stretch for __________ seconds.  Now, gently pull your toes toward the bottom of your foot. You should feel a stretch on the top of your toes and or foot.  Hold this stretch for __________ seconds. Repeat __________ times. Complete this stretch __________ times per day.  RANGE OF MOTION - Ankle Dorsiflexion, Active Assisted  Remove shoes and sit on a chair that is preferably not on a carpeted surface.  Place right / left foot under knee. Extend your opposite leg for support.  Keeping your heel down, slide your right / left foot back toward the chair until you feel a stretch at your ankle or calf. If you do not feel a stretch, slide your bottom forward to the edge of the chair, while still keeping your heel down.  Hold this stretch for __________ seconds. Repeat __________ times. Complete this stretch __________ times per day.  STRETCH  Gastroc, Standing  Place hands on wall.  Extend right / left leg, keeping the front knee somewhat bent.  Slightly point your toes inward on your back foot.  Keeping your right / left heel on the floor and your knee straight, shift your weight toward the wall, not allowing your back to arch.  You should feel a gentle stretch in the right / left calf. Hold this position for __________ seconds. Repeat __________ times. Complete this stretch __________ times per day. STRETCH  Soleus, Standing  Place hands on wall.  Extend right / left leg, keeping the other knee somewhat bent.  Slightly point your toes inward on your back foot.  Keep your right / left heel on the floor, bend your back knee, and slightly shift your weight over the back leg so that you feel a gentle stretch deep in your back calf.  Hold this position for __________ seconds. Repeat __________ times.  Complete this stretch __________ times per day. STRETCH  Gastrocsoleus, Standing  Note: This exercise can place a lot of stress on your foot and ankle. Please complete this exercise only if specifically instructed by your caregiver.   Place the ball of your right / left foot on a step, keeping your other foot firmly on the same step.  Hold on to the wall or a rail for balance.  Slowly lift your other foot, allowing your body weight to press your heel down over the edge of the step.  You should feel a stretch in your right / left calf.  Hold this position for __________ seconds.  Repeat this exercise with a slight bend in your right / left knee. Repeat __________ times. Complete this stretch __________ times per day.  STRENGTHENING EXERCISES - Plantar Fasciitis (Heel Spur Syndrome)  These exercises may help you when beginning to rehabilitate your injury. They may resolve your symptoms with or without further involvement from your physician, physical therapist or athletic trainer. While completing these exercises, remember:   Muscles can gain both the endurance and the strength needed for everyday activities through controlled exercises.  Complete these exercises as instructed by your physician, physical therapist or athletic trainer. Progress the resistance and repetitions only as guided.  Onychomycosis/Fungal  Toenails  WHAT IS IT? An infection that lies within the keratin of your nail plate that is caused by a fungus.  WHY ME? Fungal infections affect all ages, sexes, races, and creeds.  There may be many factors that predispose you to a fungal infection such as age, coexisting medical conditions such as diabetes, or an autoimmune disease; stress, medications, fatigue, genetics, etc.  Bottom line: fungus thrives in a warm, moist environment and your shoes offer such a location.  IS IT CONTAGIOUS? Theoretically, yes.  You do not want to share shoes, nail clippers or files with someone who  has fungal toenails.  Walking around barefoot in the same room or sleeping in the same bed is unlikely to transfer the organism.  It is important to realize, however, that fungus can spread easily from one nail to the next on the same foot.  HOW DO WE TREAT THIS?  There are several ways to treat this condition.  Treatment may depend on many factors such as age, medications, pregnancy, liver and kidney conditions, etc.  It is best to ask your doctor which options are available to you.  1. No treatment.   Unlike many other medical concerns, you can live with this condition.  However for many people this can be a painful condition and may lead to ingrown toenails or a bacterial infection.  It is recommended that you keep the nails cut short to help reduce the amount of fungal nail. 2. Topical treatment.  These range from herbal remedies to prescription strength nail lacquers.  About 40-50% effective, topicals require twice daily application for approximately 9 to 12 months or until an entirely new nail has grown out.  The most effective topicals are medical grade medications available through physicians offices. 3. Oral antifungal medications.  With an 80-90% cure rate, the most common oral medication requires 3 to 4 months of therapy and stays in your system for a year as the new nail grows out.  Oral antifungal medications do require blood work to make sure it is a safe drug for you.  A liver function panel will be performed prior to starting the medication and after the first month of treatment.  It is important to have the blood work performed to avoid any harmful side effects.  In general, this medication safe but blood work is required. 4. Laser Therapy.  This treatment is performed by applying a specialized laser to the affected nail plate.  This therapy is noninvasive, fast, and non-painful.  It is not covered by insurance and is therefore, out of pocket.  The results have been very good with a 60-70%  cure rate.  [facility] is the only practice in the area to offer this therapy. 5. Permanent Nail Avulsion.  Removing the entire nail so that a new nail will not grow back.

## 2013-09-11 ENCOUNTER — Telehealth: Payer: Self-pay | Admitting: *Deleted

## 2013-09-11 NOTE — Telephone Encounter (Signed)
Pt called left only his name and phone number.

## 2013-09-15 ENCOUNTER — Ambulatory Visit: Payer: BC Managed Care – PPO | Admitting: Podiatrist

## 2013-09-15 ENCOUNTER — Ambulatory Visit (INDEPENDENT_AMBULATORY_CARE_PROVIDER_SITE_OTHER): Payer: BC Managed Care – PPO | Admitting: Podiatrist

## 2013-09-15 ENCOUNTER — Encounter: Payer: Self-pay | Admitting: Podiatrist

## 2013-09-15 VITALS — BP 151/90 | HR 72 | Resp 16

## 2013-09-15 DIAGNOSIS — M722 Plantar fascial fibromatosis: Secondary | ICD-10-CM

## 2013-09-15 MED ORDER — TRIAMCINOLONE ACETONIDE 40 MG/ML IJ SUSP
20.0000 mg | Freq: Once | INTRAMUSCULAR | Status: AC
  Administered 2013-09-15: 20 mg

## 2013-09-15 MED ORDER — PREDNISONE 10 MG PO KIT: PACK | ORAL | Status: DC

## 2013-09-15 NOTE — Progress Notes (Signed)
  Subjective: Charles Wells presents today with his wife for heel pain which continues to be uncomfortable on his right heel. He states that he does not like taking the ibuprofen and it's not really helping. He also states the cream for his skin on his feet also doesn't appear to be helping. The patient's wife claims that his feet became red after her trip to the beach and she wants to know if he got a fungus from the sand. Patient states the last injection was beneficial for about a week however now is worn off. He has also been wearing his power step inserts in his boots.  Objective: Continued plantar fascial pain noted right. Tinea pedis continues to be present right and left feet. Neuro vascular status is unchanged. Patient denies any back injury or issues.  Assessment: Plantar fasciitis right, tinea pedis, onychomycosis  Plan: A corticosteroid injection consisting of Kenalog and Marcaine plain was administered under sterile technique to the right foot. Wrote a prescription for a steroid Dosepak to take as well. Also instructed him to continue taking the Lamisil and this will help with the tinea infection and toenails. Discussed with his wife it is more likely he pick up the infection from a shower versus sand on the beach. Also dispensed a night splint for his use and I will see him back in one month for followup. Briefly discussed the possibility of surgery should his pain continue. He is also eligible for some orthotic inserts and he will be casted at his convenience.

## 2013-09-22 ENCOUNTER — Other Ambulatory Visit: Payer: BC Managed Care – PPO

## 2013-09-22 DIAGNOSIS — M722 Plantar fascial fibromatosis: Secondary | ICD-10-CM

## 2013-09-24 HISTORY — PX: PROSTATE BIOPSY: SHX241

## 2013-09-30 DIAGNOSIS — R0781 Pleurodynia: Secondary | ICD-10-CM | POA: Insufficient documentation

## 2014-03-09 ENCOUNTER — Other Ambulatory Visit: Payer: Self-pay | Admitting: Podiatrist

## 2014-03-09 ENCOUNTER — Ambulatory Visit (INDEPENDENT_AMBULATORY_CARE_PROVIDER_SITE_OTHER): Payer: BC Managed Care – PPO | Admitting: *Deleted

## 2014-03-09 DIAGNOSIS — B351 Tinea unguium: Secondary | ICD-10-CM

## 2014-03-09 DIAGNOSIS — M722 Plantar fascial fibromatosis: Secondary | ICD-10-CM

## 2014-03-09 MED ORDER — TERBINAFINE HCL 250 MG PO TABS: 250.0000 mg | ORAL_TABLET | Freq: Every day | ORAL | Status: DC

## 2014-03-09 NOTE — Progress Notes (Signed)
° °  Subjective:    Patient ID: Charles Wells, male    DOB: 1956/06/24, 58 y.o.   MRN: [id number]  HPI I AM HERE TO GET MY INSERTS     Review of Systems     Objective:   Physical Exam        Assessment & Plan:

## 2014-03-15 ENCOUNTER — Telehealth: Payer: Self-pay | Admitting: *Deleted

## 2014-03-15 NOTE — Telephone Encounter (Signed)
I'm having problem with my right foot with the heel spur.  I got some questions I'd like to ask.  So, if you would give me a call back.

## 2014-03-15 NOTE — Telephone Encounter (Signed)
I CALLED AND SPOKE WITH THE PATIENT AND MY HEEL IS STILL BOTHERING ME AND STARTED 2 WEEKS AGO AND IS TAKEN MY PAIN MEDICINE AND I ALSO HAVE COMPRESSION STOCKING DUE TO THE SWELLING AND ICE AND ELEVATE AND I TOLD HIM IF HE WAS NO BETTER CALL THE [facility] OFFICE. LISA

## 2014-03-17 ENCOUNTER — Ambulatory Visit (INDEPENDENT_AMBULATORY_CARE_PROVIDER_SITE_OTHER): Payer: BC Managed Care – PPO | Admitting: Podiatrist

## 2014-03-17 ENCOUNTER — Encounter: Payer: Self-pay | Admitting: Podiatrist

## 2014-03-17 VITALS — BP 148/82 | HR 98 | Resp 18

## 2014-03-17 DIAGNOSIS — M722 Plantar fascial fibromatosis: Secondary | ICD-10-CM

## 2014-03-17 MED ORDER — PREDNISONE 10 MG PO KIT: PACK | ORAL | Status: DC

## 2014-03-17 MED ORDER — TRIAMCINOLONE ACETONIDE 10 MG/ML IJ SUSP
10.0000 mg | Freq: Once | INTRAMUSCULAR | Status: AC
  Administered 2014-03-17: 10 mg

## 2014-03-17 NOTE — Patient Instructions (Signed)

## 2014-03-22 NOTE — Progress Notes (Signed)
Chief Complaint  Patient presents with  . Plantar Fasciitis    Follow up right heel  "My family doctor wrote me a prescription for insoles and they haven't come in yet. My heel is killing me"     HPI: Patient is 58 y.o. male who presents today for right heel pain.  He states his plantar fasciitis is flared and it is really painful. He wears his orthotics in his workboots and does not wear them on a daily basis in his other shoes.      Physical Exam Neurovascular status unchanged.  Plantar fasciitis symptomatology is present right heel.  Pain on palpation and with pressure is present.  No pain with medial to lateral squeeze of the heel.    Assessment: plantar fasciitis right foot  Plan: injected the right heel with kenalog and marcaine plain.  Recommended oral steroids, antiinflammatory medications and use of the orthotic and possibly and adjustment of the orthotic.

## 2014-03-24 ENCOUNTER — Telehealth: Payer: Self-pay | Admitting: *Deleted

## 2014-03-24 NOTE — Telephone Encounter (Signed)
Called patient back and stated that he can take Ibuprofen and take it as directed or patient can take his vicodin and to call us if he is not any better. lisa

## 2014-03-24 NOTE — Telephone Encounter (Signed)
Call me back about my right foot.  I had an injection last week but the pain is in a different spot.  Give me a call if you would.

## 2014-05-19 ENCOUNTER — Ambulatory Visit (INDEPENDENT_AMBULATORY_CARE_PROVIDER_SITE_OTHER): Payer: BC Managed Care – PPO | Admitting: Podiatrist

## 2014-05-19 ENCOUNTER — Encounter: Payer: Self-pay | Admitting: Podiatrist

## 2014-05-19 VITALS — BP 162/92 | HR 78 | Resp 18

## 2014-05-19 DIAGNOSIS — M722 Plantar fascial fibromatosis: Secondary | ICD-10-CM

## 2014-05-19 MED ORDER — TRIAMCINOLONE ACETONIDE 10 MG/ML IJ SUSP
10.0000 mg | Freq: Once | INTRAMUSCULAR | Status: AC
  Administered 2014-05-19: 10 mg

## 2014-05-21 ENCOUNTER — Telehealth: Payer: Self-pay | Admitting: *Deleted

## 2014-05-21 NOTE — Progress Notes (Signed)
Chief Complaint  Patient presents with  . Plantar Fasciitis    Follow up heel right  "I am having so much pain. My regular doctor did bloodwork and thought I might have gout. I haven't gotten those results yet"     HPI: Patient is 58 y.o. male who presents today for followup of right heel pain. He states that the heel pain has returned since I saw him last. He relates the injections are beneficial however once they were off the plantar fasciitis heel pain returns. Physical Exam  Neurovascular status is unchanged. Continued pain on palpation plantar medial aspect of the right heel is noted. Generalized pain in the right foot at the great toe and at the ankle were also present subjectively.   Assessment: Plantar fasciitis, possible gout right  Plan: Injected the right heel in the area of maximal tenderness without complication. Instructed him to followup with his primary care physician regarding the labs he had done. It is a possibility that he could have gout which could flare the plantar fasciitis as well. He'll be seen back for recheck and will call if any problems arise.

## 2014-05-21 NOTE — Telephone Encounter (Signed)
I need someone to call me back.  It's pertaining to me coming in on Wednesday and getting an injection in my foot. Please give me a call back thank you.

## 2014-05-21 NOTE — Telephone Encounter (Signed)
I left him a message that I was returning his call.  Please give [place] a call on Monday.

## 2014-05-24 ENCOUNTER — Telehealth: Payer: Self-pay | Admitting: *Deleted

## 2014-05-24 ENCOUNTER — Other Ambulatory Visit: Payer: Self-pay | Admitting: Podiatrist

## 2014-05-24 DIAGNOSIS — M722 Plantar fascial fibromatosis: Secondary | ICD-10-CM

## 2014-05-24 MED ORDER — PREDNISONE 10 MG PO KIT: PACK | ORAL | Status: DC

## 2014-05-24 NOTE — Telephone Encounter (Signed)
i am working on this and i have been messaging Dr Valentina Lucks and I am waiting on a response from her. Charles Wells

## 2014-05-24 NOTE — Telephone Encounter (Signed)
Elam said he saw you guys last week and you were supposed to call in prescription.  We never received that please give [place] a call.

## 2014-05-24 NOTE — Telephone Encounter (Signed)
I'm calling because she said she was going to give me a prescription for Prednisone.  I went and saw my primary care doctor.  The labs did not show anything wrong.  I need to know what to do, my foot is swollen really bad.  I informed the patient that he probably needs to schedule an appointment.  I told him Dr. Valentina Lucks is not in the office today.  He said so there is no way you can get in contact with her.  I told him no, she will be in tomorrow.   He said, Aw shit, never mind and hung the phone up.

## 2014-05-25 ENCOUNTER — Ambulatory Visit: Payer: BC Managed Care – PPO | Admitting: Thoracic Surgery (Cardiothoracic Vascular Surgery)

## 2014-05-25 NOTE — Telephone Encounter (Signed)
DR Valentina Lucks CALLED IN A DOSE PACK FOR THE PATIENT. LISA

## 2014-05-28 ENCOUNTER — Encounter: Payer: Self-pay | Admitting: *Deleted

## 2014-05-28 DIAGNOSIS — J449 Chronic obstructive pulmonary disease, unspecified: Secondary | ICD-10-CM | POA: Insufficient documentation

## 2014-05-28 DIAGNOSIS — M722 Plantar fascial fibromatosis: Secondary | ICD-10-CM | POA: Insufficient documentation

## 2014-05-28 DIAGNOSIS — G4730 Sleep apnea, unspecified: Secondary | ICD-10-CM | POA: Insufficient documentation

## 2014-05-28 DIAGNOSIS — I10 Essential (primary) hypertension: Secondary | ICD-10-CM | POA: Insufficient documentation

## 2014-05-28 DIAGNOSIS — J45909 Unspecified asthma, uncomplicated: Secondary | ICD-10-CM | POA: Insufficient documentation

## 2014-05-28 DIAGNOSIS — R0781 Pleurodynia: Secondary | ICD-10-CM

## 2014-05-28 DIAGNOSIS — J439 Emphysema, unspecified: Secondary | ICD-10-CM | POA: Insufficient documentation

## 2014-05-28 DIAGNOSIS — F419 Anxiety disorder, unspecified: Secondary | ICD-10-CM | POA: Insufficient documentation

## 2014-05-28 DIAGNOSIS — K219 Gastro-esophageal reflux disease without esophagitis: Secondary | ICD-10-CM | POA: Insufficient documentation

## 2014-06-01 ENCOUNTER — Institutional Professional Consult (permissible substitution) (INDEPENDENT_AMBULATORY_CARE_PROVIDER_SITE_OTHER): Payer: BC Managed Care – PPO | Admitting: Thoracic Surgery (Cardiothoracic Vascular Surgery)

## 2014-06-01 ENCOUNTER — Encounter: Payer: Self-pay | Admitting: Thoracic Surgery (Cardiothoracic Vascular Surgery)

## 2014-06-01 VITALS — BP 171/98 | HR 103 | Ht 68.0 in | Wt 250.0 lb

## 2014-06-01 DIAGNOSIS — M940 Chondrocostal junction syndrome [Tietze]: Secondary | ICD-10-CM

## 2014-06-01 DIAGNOSIS — G8929 Other chronic pain: Secondary | ICD-10-CM

## 2014-06-01 NOTE — Progress Notes (Signed)
PCP is Secundino Ginger, PA-C Referring Provider is Secundino Ginger, PA-C  Chief Complaint  Patient presents with  . NEW THORACIC    RIB FRACTURE/CT CHEST    HPI: 58 year old gentleman who is sent for evaluation for chronic chest wall pain.  Charles Wells is a 58 year old gentleman who injured himself back in January. 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 He was shutting that the worse on his regular when the door suddenly gave way and he experienced a sudden intense pain in his right anterior chest. He thought he had broken a rib. 911 was called and he was taken to the emergency department with EMS. Workup included chest x-ray which did not show any rib fractures.  He was treated with nonsteroidal anti-inflammatories, resting physical therapy. He did not respond to that. He was seen by Dr. Gladstone Lighter. He was treated with iontophoresis. Despite these measures his continued to have pain. He also has been treated with steroids. He recently has been started on the Lyrica. He says that despite all these measures he continued to have pain. He is unable to pull himself up into his truck with his right hand. He is unable to lift a 12 pack of bottled water. He does not feel like he can carry out his duties at his job.   Past Medical History  Diagnosis Date  . Depression   . Plantar fasciitis, right   . Acid reflux   . COPD (chronic obstructive pulmonary disease)   . Emphysema of lung   . Asthma   . Hypertension   . Sleep apnea   . Arthritis     rheumatoid  . Anxiety     Past Surgical History  Procedure Laterality Date  . Cervical disc surgery      Family History  Problem Relation Age of Onset  . Depression Mother   . Hypertension Mother   . Heart disease Mother   . Arthritis Mother   . Stroke Father   . Depression Father   . Hypertension Father   . COPD Father   . Heart disease Father   . Arthritis Father   . Kidney disease Father   . Cancer Father     Social History History  Substance Use  Topics  . Smoking status: Current Every Day Smoker -- 1.00 packs/day    Types: Cigarettes  . Smokeless tobacco: Not on file  . Alcohol Use: Yes    Current Outpatient Prescriptions  Medication Sig Dispense Refill  . albuterol (PROVENTIL HFA;VENTOLIN HFA) 108 (90 BASE) MCG/ACT inhaler Inhale 2 puffs into the lungs every 6 (six) hours as needed for wheezing.      Marland Kitchen ALPRAZolam (XANAX) 1 MG tablet Take 1 mg by mouth 3 (three) times daily as needed for anxiety.      . Azelastine-Fluticasone (DYMISTA) 137-50 MCG/ACT SUSP Place into the nose 2 (two) times daily. 1 spray in each nostril      . beclomethasone (QVAR) 40 MCG/ACT inhaler Inhale 2 puffs into the lungs 2 (two) times daily.      . Budesonide-Formoterol Fumarate (SYMBICORT IN) Inhale into the lungs.      Marland Kitchen buPROPion (WELLBUTRIN) 100 MG tablet Take 100 mg by mouth 2 (two) times daily.      . cholecalciferol (VITAMIN D) 400 UNITS TABS tablet Take 50,000 Units by mouth. Take 1 capsule by mouth weekly for 8 weeks      . cyclobenzaprine (FLEXERIL) 5 MG tablet Take 5 mg by mouth 3 (three) times daily  as needed for muscle spasms.      . Hydrocodone-Acetaminophen (VICODIN PO) Take 325 mg by mouth. 10/325 mg      . loratadine (CLARITIN) 10 MG tablet Take 10 mg by mouth daily.      . magnesium oxide (MAG-OX) 400 MG tablet Take 400 mg by mouth daily.      . Naftifine HCl (NAFTIN) 2 % CREA Apply 1 application topically 1 day or 1 dose.  60 g  2  . olmesartan-hydrochlorothiazide (BENICAR HCT) 20-12.5 MG per tablet Take 1 tablet by mouth daily.      Marland Kitchen omeprazole (PRILOSEC) 20 MG capsule Take 20 mg by mouth daily.      . potassium chloride SA (K-DUR,KLOR-CON) 20 MEQ tablet Take 20 mEq by mouth 2 (two) times daily.      . PredniSONE 10 MG KIT Take by mouth.      . pregabalin (LYRICA) 100 MG capsule Take 100 mg by mouth 2 (two) times daily.      Marland Kitchen terbinafine (LAMISIL) 250 MG tablet Take 1 tablet (250 mg total) by mouth daily.  30 tablet  0  . Ascorbic  Acid (VITAMIN C PO) Take 1 tablet by mouth daily.      Marland Kitchen ibuprofen (ADVIL,MOTRIN) 200 MG tablet Take 200 mg by mouth every 6 (six) hours as needed (800 mg as needed for pain).       . montelukast (SINGULAIR) 10 MG tablet Take 10 mg by mouth at bedtime.      . Omega-3 Fatty Acids (FISH OIL) 1000 MG CAPS Take 1 capsule by mouth daily.      . tadalafil (CIALIS) 5 MG tablet Take 5 mg by mouth daily.       No current facility-administered medications for this visit.    Allergies  Allergen Reactions  . Shellfish Allergy     Review of Systems  Constitutional: Positive for fatigue and unexpected weight change (weight gain).  HENT: Positive for dental problem.   Respiratory: Positive for apnea (CPAP QHS), cough, chest tightness, shortness of breath and wheezing.   Cardiovascular: Positive for chest pain (chest wall pain) and leg swelling.  Gastrointestinal: Positive for abdominal pain.       Reflux  Genitourinary: Positive for frequency.  Musculoskeletal: Positive for arthralgias, back pain, joint swelling and myalgias.  Hematological: Bruises/bleeds easily.  Psychiatric/Behavioral: Positive for dysphoric mood. The patient is nervous/anxious.   All other systems reviewed and are negative.   BP 171/98  Pulse 103  Ht 5' 8"  (1.727 m)  Wt 250 lb (113.399 kg)  BMI 38.02 kg/m2  SpO2 97% Physical Exam  Vitals reviewed. Constitutional: He is oriented to person, place, and time. No distress.  obese  HENT:  Head: Normocephalic and atraumatic.  Eyes: Pupils are equal, round, and reactive to light.  Neck: No thyromegaly present.  Cardiovascular: Normal rate, regular rhythm and normal heart sounds.   No murmur heard. Pulmonary/Chest: Effort normal and breath sounds normal. He has no wheezes.  Tender to palpation along right costal margin and xiphoid process  Abdominal: Soft. There is tenderness (along right costal margin).  Musculoskeletal: He exhibits no edema.  Lymphadenopathy:    He has  no cervical adenopathy.  Neurological: He is alert and oriented to person, place, and time. No cranial nerve deficit.  Skin: Skin is warm and dry.     Diagnostic Tests: CT chest [facility] 01/29/2014 Findings: No comparison studies. The trachea and mainstem bronchi are patent. Lungs demonstrate mild peripheral  interstitial fibrotic change. No adenopathy. Normal heart size. No pleural or pericardial effusion. Coronary and aortic ASPVD. Negative upper abdomen. No rib fractures are identified. Mild thoracic spondylosis. Cervical DDD.  Impression: Mild peripheral interstitial fibrotic change. Coronary and aortic ASPVD. Mild thoracic spondylosis. Cervical DDD. Otherwise negative.  Impression: 58 year old man with costochondritis. He has persistent pain. He had a CT scan back in May which was unremarkable. I do not have any surgical treatment to offer him.   While it is not unusual to still have pain for months months after a costochondral injury, his pain is certainly on the extreme end of the scale.  My recommendation to him is that he be seen in a pain clinic. They might be able to do a nerve block, which could possibly result in pain relief. There really is no downside to trying that. He is reluctant to go to a pain clinic. He is concerned that if he has a nerve block and loses sensation that he might injure himself further.   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 However this is completely related to the pain and not the underlying injury itself. If his pain could be controlled then there is no reason he could not go back to work.  Plan: Recommended the patient be seen in a pain clinic

## 2014-12-24 DIAGNOSIS — Z0271 Encounter for disability determination: Secondary | ICD-10-CM

## 2015-02-22 ENCOUNTER — Ambulatory Visit (INDEPENDENT_AMBULATORY_CARE_PROVIDER_SITE_OTHER): Payer: BLUE CROSS/BLUE SHIELD | Admitting: Podiatry

## 2015-02-22 ENCOUNTER — Ambulatory Visit (INDEPENDENT_AMBULATORY_CARE_PROVIDER_SITE_OTHER): Payer: BLUE CROSS/BLUE SHIELD

## 2015-02-22 ENCOUNTER — Encounter: Payer: Self-pay | Admitting: Podiatry

## 2015-02-22 VITALS — BP 106/93 | HR 93 | Resp 18

## 2015-02-22 DIAGNOSIS — M79671 Pain in right foot: Secondary | ICD-10-CM

## 2015-02-22 DIAGNOSIS — M779 Enthesopathy, unspecified: Secondary | ICD-10-CM

## 2015-02-22 MED ORDER — TRIAMCINOLONE ACETONIDE 10 MG/ML IJ SUSP
10.0000 mg | Freq: Once | INTRAMUSCULAR | Status: AC
  Administered 2015-02-22: 10 mg

## 2015-02-23 NOTE — Progress Notes (Signed)
Subjective:     Patient ID: Charles Wells, male   DOB: [birth year], 59 y.o.   MRN: [id number]  HPI patient presents stating that he feels pretty good in his mid foot but has occasional pain but has developed a lot of pain in the outside of his right foot which may be due to walking differently from the fracture that he had in his mid foot   Review of Systems     Objective:   Physical Exam Neurovascular status intact muscle strength adequate with discomfort in the fifth MPJ right with fluid buildup and mild discomfort in the midfoot region had a previous fracture of the third metatarsal base    Assessment:     What appears to be a healing fracture third metatarsal base but difficult to say whether completely healed but not clinically symptomatic currently with inflammation and capsulitis fifth MPJ    Plan:     I carefully injected the fifth MPJ right 3 mg Kenalog 5 mg Xylocaine and advised that if the symptoms do not improve in the right forefoot or if it should start to become bad again we will need to consider a bone stimulator to try to improve the healing process. Reappoint to recheck in 3 weeks to see response to medication

## 2015-03-22 ENCOUNTER — Encounter: Payer: Self-pay | Admitting: Podiatry

## 2015-03-22 ENCOUNTER — Ambulatory Visit (INDEPENDENT_AMBULATORY_CARE_PROVIDER_SITE_OTHER): Payer: BLUE CROSS/BLUE SHIELD | Admitting: Podiatry

## 2015-03-22 ENCOUNTER — Ambulatory Visit (INDEPENDENT_AMBULATORY_CARE_PROVIDER_SITE_OTHER): Payer: BLUE CROSS/BLUE SHIELD

## 2015-03-22 ENCOUNTER — Telehealth: Payer: Self-pay | Admitting: *Deleted

## 2015-03-22 VITALS — BP 120/78 | HR 83 | Resp 15

## 2015-03-22 DIAGNOSIS — M722 Plantar fascial fibromatosis: Secondary | ICD-10-CM | POA: Diagnosis not present

## 2015-03-22 DIAGNOSIS — R609 Edema, unspecified: Secondary | ICD-10-CM | POA: Diagnosis not present

## 2015-03-22 DIAGNOSIS — M8430XG Stress fracture, unspecified site, subsequent encounter for fracture with delayed healing: Secondary | ICD-10-CM

## 2015-03-22 NOTE — Progress Notes (Signed)
Subjective:     Patient ID: Charles Wells, male   DOB: 03-01-1956, 59 y.o.   MRN: [id number]  HPI patient continues to have exquisite discomfort in the right foot and I noted quite a bit of pain in the right fourth metatarsal phalangeal joint with continued midfoot pain and edema in the lateral side of the foot. Did not respond well to injection treatment and states he feels like recently it's been getting worse. He did have a fracture of his third metatarsal several years ago which is very difficult to tell whether or not it is healed whether or not there may be a nonunion   Review of Systems     Objective:   Physical Exam Neurovascular status unchanged with exquisite discomfort midfoot right and pain in the outside of the right foot around the fifth metatarsal base and around the fifth metatarsal itself. Patient is quite a bit of discomfort also in the fourth metatarsophalangeal joint right    Assessment:     May have a nonunion or fracture third metatarsal right possibility for other fracture or arthritic condition in the midfoot and inflammatory capsulitis fourth MPJ right    Plan:     Due to intensity of discomfort and failure to respond and length of time she's had this problem over 2 years we did go ahead today and schedule him for a CT scan to try to understand better the pathology he is experiencing. We will get the results of that and decide what else we may need to do to help this patient

## 2015-03-22 NOTE — Telephone Encounter (Addendum)
Dr. Paulla Dolly ordered CT Scan of right foot r/o non-union of 3rd metatarsal or new fracture.  Orders faxed to [facility].  [place] prior authorization # [id number], faxed to [facility].

## 2015-03-23 ENCOUNTER — Other Ambulatory Visit: Payer: Self-pay | Admitting: Podiatry

## 2015-03-23 DIAGNOSIS — M8430XG Stress fracture, unspecified site, subsequent encounter for fracture with delayed healing: Secondary | ICD-10-CM

## 2015-03-29 ENCOUNTER — Ambulatory Visit
Admission: RE | Admit: 2015-03-29 | Discharge: 2015-03-29 | Disposition: A | Discharge status: Home / Self Care | Payer: Self-pay | Source: Ambulatory Visit | Attending: Podiatry | Admitting: Podiatry

## 2015-03-29 ENCOUNTER — Ambulatory Visit
Admission: RE | Admit: 2015-03-29 | Discharge: 2015-03-29 | Disposition: A | Discharge status: Home / Self Care | Payer: BLUE CROSS/BLUE SHIELD | Source: Ambulatory Visit | Attending: Podiatry | Admitting: Podiatry

## 2015-03-29 DIAGNOSIS — M8430XG Stress fracture, unspecified site, subsequent encounter for fracture with delayed healing: Secondary | ICD-10-CM

## 2015-03-29 IMAGING — CT CT 3D INDEPENDENT WKST
3 of 5 series · 10 of 20 positions shown, 11 images · non-contrast
Comparison: none

CLINICAL DATA: Abnormal findings on recent x-ray imaging which
demonstrated a fracture of third metatarsal.

EXAM:
3-DIMENSIONAL CT IMAGE RENDERING ON INDEPENDENT WORKSTATION
TECHNIQUE: 3-dimensional CT images were rendered by post-processing of the
original CT data at independent workstation.

[Series 4: soft tissue lower extremity · axial · 0.53mm/px · z∈[-156,-66]mm · 4 of 76 slices shown]
[im 16/76  soft-tissue]
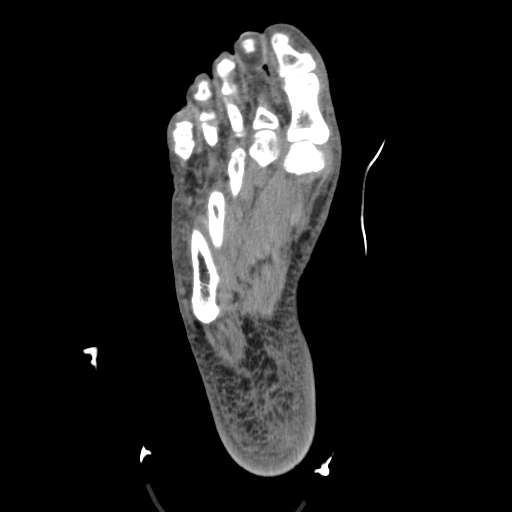
[im 31/76  soft-tissue]
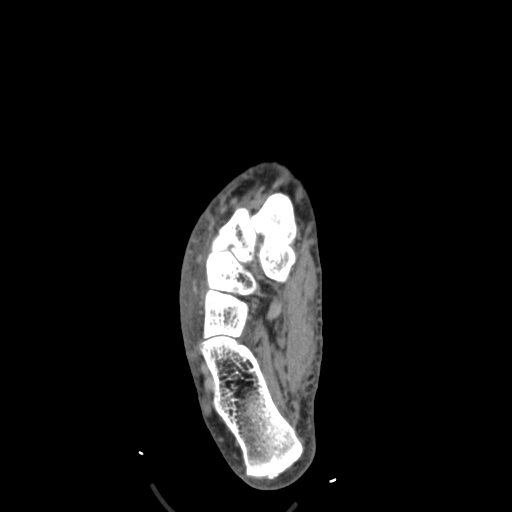
[im 46/76  soft-tissue]
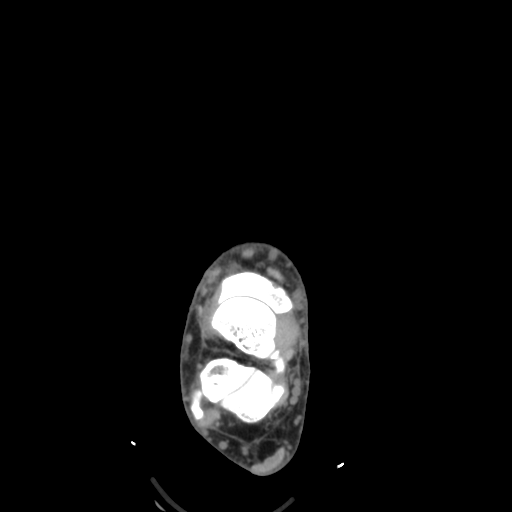
[im 61/76  soft-tissue]
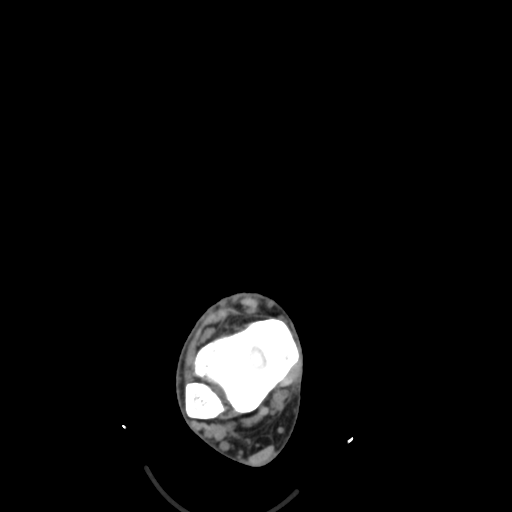

[Series 603: axial forefoot · coronal · 0.42mm/px · 3 of 118 slices shown]
[im 64/118  bone]
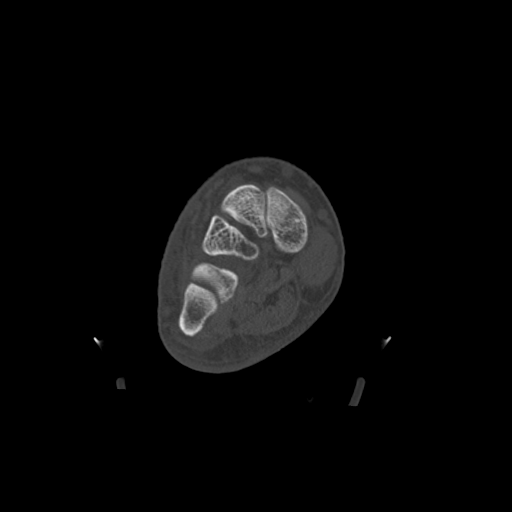
[im 90/118  bone]
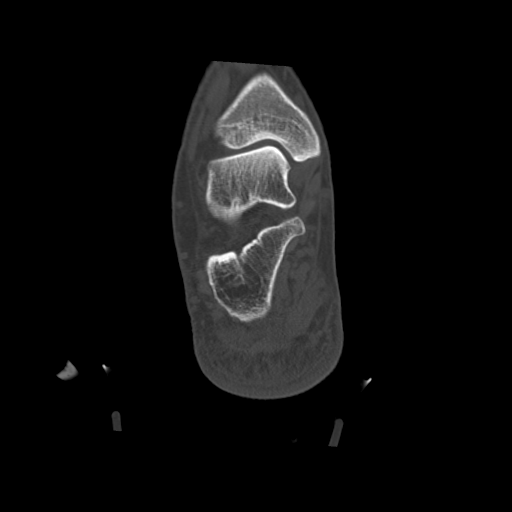
[im 117/118  bone]
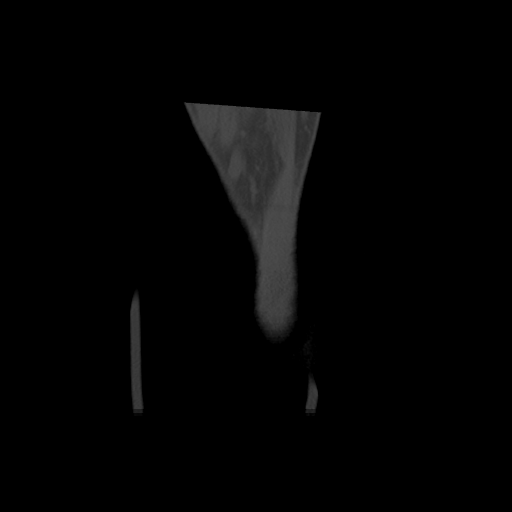

[Series 605: cor forefoot · axial · 0.53mm/px · z∈[-219,-156]mm · 3 of 71 slices shown, 4 images]
[im 18/71  soft-tissue]
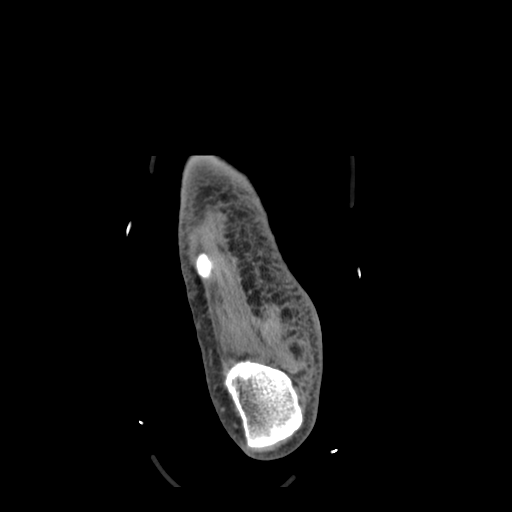
[im 18/71  bone]
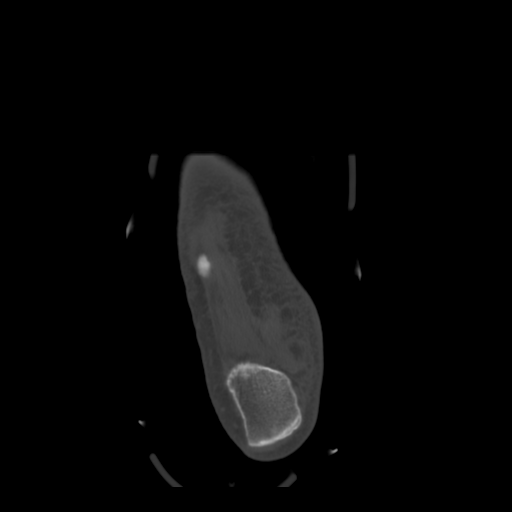
[im 36/71  bone]
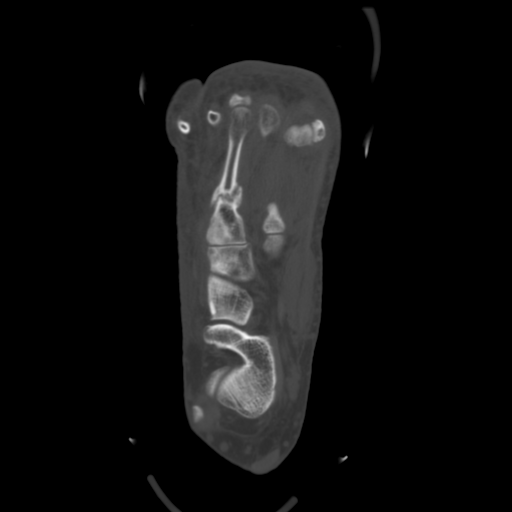
[im 53/71  bone]
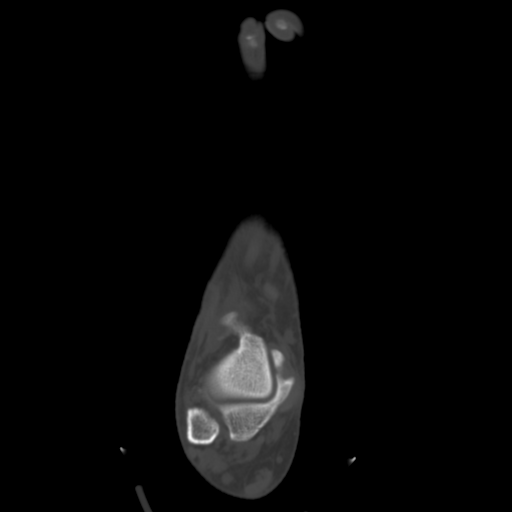

[10 of 20 positions shown; findings below may reference images not displayed]

FINDINGS: Three dimensional reconstructions were performed by the radiologist
at an independent workstation.
IMPRESSION: As above.

## 2015-03-31 ENCOUNTER — Telehealth: Payer: Self-pay | Admitting: *Deleted

## 2015-03-31 NOTE — Telephone Encounter (Addendum)
Pt request CT Scan.  Dr. 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  I called Donley Redder for the Bone Growth Stimulator ([employer]), he will pick up the pt's information today.  I informed pt to continue to wear the medical boot, and he will be looking for Trey's call.

## 2015-04-15 ENCOUNTER — Telehealth: Payer: Self-pay | Admitting: *Deleted

## 2015-04-18 NOTE — Telephone Encounter (Signed)
Entered in error

## 2015-05-11 ENCOUNTER — Telehealth: Payer: Self-pay | Admitting: *Deleted

## 2015-05-11 NOTE — Telephone Encounter (Signed)
Judson Roch called to check on the status of the Letter of Medical Necessity for pt.

## 2015-05-12 ENCOUNTER — Telehealth: Payer: Self-pay | Admitting: *Deleted

## 2015-05-12 ENCOUNTER — Ambulatory Visit (INDEPENDENT_AMBULATORY_CARE_PROVIDER_SITE_OTHER): Payer: BLUE CROSS/BLUE SHIELD | Admitting: Podiatry

## 2015-05-12 DIAGNOSIS — M8430XG Stress fracture, unspecified site, subsequent encounter for fracture with delayed healing: Secondary | ICD-10-CM

## 2015-05-12 NOTE — Telephone Encounter (Signed)
Pt states he's coming in today, because his boot is torn and he has some pain, the insurance co will not pay for the machine Dr. Paulla Dolly is ordering.

## 2015-05-13 NOTE — Progress Notes (Signed)
Subjective:     Patient ID: Charles Wells, male   DOB: 29-Mar-1956, 59 y.o.   MRN: [id number]  HPI patient presents stating my boot has fallen apart and I want to get that bone stimulator for my nonhealed fracture on my right third metatarsal   Review of Systems     Objective:   Physical Exam Neurovascular status found to be unchanged with continued midfoot edema and exquisite tenderness around the third metatarsal base where there was a fracture which occurred approximately 18 months ago which has developed a nonunion confirmed on CT scan    Assessment:     Nonunion of the third metatarsal right most likely causing the majority of his pain    Plan:     Reviewed condition with patient and we will again try to work with insurance to get him authorized for the bone stimulator which he requires at this time due to this nonunion before we would consider surgery. If not successful for healing work and the need to consider resecting bone bone graft and plating of this area even though that might not completely solve all the discomfort he experiences in his foot area and dispensed a new air fracture walker today as it's the only way that he can walk and unfortunately tears them apart due to his activity

## 2015-05-13 NOTE — Telephone Encounter (Signed)
Not sure what for

## 2015-05-24 ENCOUNTER — Telehealth: Payer: Self-pay | Admitting: *Deleted

## 2015-05-24 NOTE — Telephone Encounter (Signed)
Pt states [employer] nurse case manager states for pt to be approved for the Bone Growth Stimulator, Dr. Paulla Dolly would need to do a PEER TO PEER [phone] then press 1 or 2 at prompts and a representative will transfer to [place] doctor.

## 2015-06-30 ENCOUNTER — Encounter: Payer: Self-pay | Admitting: Podiatry

## 2015-06-30 ENCOUNTER — Ambulatory Visit (INDEPENDENT_AMBULATORY_CARE_PROVIDER_SITE_OTHER): Payer: BLUE CROSS/BLUE SHIELD | Admitting: Podiatry

## 2015-06-30 ENCOUNTER — Ambulatory Visit (INDEPENDENT_AMBULATORY_CARE_PROVIDER_SITE_OTHER): Payer: BLUE CROSS/BLUE SHIELD

## 2015-06-30 VITALS — BP 115/70 | HR 88 | Resp 12

## 2015-06-30 DIAGNOSIS — M8430XG Stress fracture, unspecified site, subsequent encounter for fracture with delayed healing: Secondary | ICD-10-CM | POA: Diagnosis not present

## 2015-06-30 DIAGNOSIS — S92301K Fracture of unspecified metatarsal bone(s), right foot, subsequent encounter for fracture with nonunion: Secondary | ICD-10-CM

## 2015-06-30 DIAGNOSIS — R600 Localized edema: Secondary | ICD-10-CM

## 2015-07-02 NOTE — Progress Notes (Signed)
Subjective:     Patient ID: Charles Wells, male   DOB: 1956-09-02, 59 y.o.   MRN: [id number]  HPI patient presents with continued severe pain in the dorsum of his right foot and he has been wearing a boot for the last several months which is not resolving or relieving his pain and he walks differently and creates other problems and he has had a CT scan indicating a pseudoarthrosis of the metatarsal   Review of Systems     Objective:   Physical Exam Neurovascular status intact muscle strength adequate with patient having exquisite tenderness in the third metatarsal proximal shaft with edema and mild discomfort on the lateral side of the foot which is most likely compensatory in nature. 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 Patient wants to have this done understanding everything as outlined and at this time I allowed him to read a consent form for arthrosis proximal third metatarsal with insertion of bone graft that we will most likely get from the calcaneus and at that time plate insertion to attempt to heal this chronic malunion. I allowed him to read and then sign consent form after extensive review and he understands he'll be nonweightbearing for 4-6 weeks and the told recovery. We'll take 6 months to one year and that all risk is present. Patient wants surgery signed consent form and we did give him a new boot that  he will use for the after surgical. As his boot is no longer effectively working due to extended use

## 2015-07-04 ENCOUNTER — Telehealth: Payer: Self-pay | Admitting: *Deleted

## 2015-07-04 NOTE — Telephone Encounter (Addendum)
Pt states he was expecting a call from Dr. Paulla Dolly after, he spoke with a partner concerning pt's surgery.  Informed pt of Dr. Mellody Drown statement and pt was unaware of the date, but states he was told he would need a knee scooter, and he said he would need crutches for his steps.  I told him I would have [facility] - Surgery Coordinator to call and go over his date, his instruction and the post-op equipment.

## 2015-07-05 NOTE — Telephone Encounter (Signed)
Charles Wells,  Pt needs clarification on his surgery with Dr. Jacqualyn Posey, and knee scooter, and crutches.  Marcy Siren

## 2015-07-05 NOTE — Telephone Encounter (Signed)
I think his surgery is scheduled for the 1st week of November with Dr. Jacqualyn Posey. Please let him know

## 2015-07-06 ENCOUNTER — Telehealth: Payer: Self-pay | Admitting: *Deleted

## 2015-07-06 DIAGNOSIS — Z9889 Other specified postprocedural states: Secondary | ICD-10-CM

## 2015-07-06 DIAGNOSIS — M8430XG Stress fracture, unspecified site, subsequent encounter for fracture with delayed healing: Secondary | ICD-10-CM

## 2015-07-06 NOTE — Telephone Encounter (Signed)
Left message informing pt the date of surgery would be 07/26/2015 with Dr. Paulla Dolly and Dr. Jacqualyn Posey assisting, I would order the knee scooter, and crutches and they should be delivered around the date of his surgery.

## 2015-07-07 ENCOUNTER — Telehealth: Payer: Self-pay | Admitting: *Deleted

## 2015-07-07 NOTE — Telephone Encounter (Signed)
"  I'm here at [facility] to pick up my scooter and crutches.  They said they only had prescription for crutches.  I need a prescription to get a scooter too."  What's the fax number there, I'll fax it over now.  "This is Marya Amsler at [facility].  You can fax it to [phone] to my attention.  Prescriptions for scooter and crutches was faxed to [facility].

## 2015-07-08 NOTE — Telephone Encounter (Signed)
Entered in error

## 2015-07-21 ENCOUNTER — Telehealth: Payer: Self-pay | Admitting: *Deleted

## 2015-07-21 NOTE — Telephone Encounter (Signed)
I'm returning your call.  I couldn't understand your message because there was static.  "I just wanted to know if my surgery had been approved by my insurance."  You have [facility] and it doesn't require prior authorization.

## 2015-07-26 ENCOUNTER — Telehealth: Payer: Self-pay | Admitting: *Deleted

## 2015-07-26 DIAGNOSIS — M8430XG Stress fracture, unspecified site, subsequent encounter for fracture with delayed healing: Secondary | ICD-10-CM | POA: Diagnosis not present

## 2015-07-26 HISTORY — PX: BONE RESECTION: SHX897

## 2015-07-26 NOTE — Telephone Encounter (Signed)
"  I just had surgery with Dr. Paulla Dolly.  He had called and said to order a knee scooter.  I already have a knee scooter and crutches.  Cancel that order please."  Okay, I will.  Take care.

## 2015-07-29 ENCOUNTER — Encounter: Payer: Self-pay | Admitting: Podiatry

## 2015-07-29 ENCOUNTER — Telehealth: Payer: Self-pay | Admitting: *Deleted

## 2015-07-29 NOTE — Progress Notes (Signed)
Patient ID: Charles Wells, male   DOB: Sep 01, 1956, 59 y.o.   MRN: [id number] X-ray report from 02/22/15  2 views of a skeletally mature person was obtained including AP and lateral views.   There is evidence of a healing fracture of the 3rd metatarsal diaphysis near the base. No other evidence of acute fracture.

## 2015-08-04 ENCOUNTER — Encounter: Payer: Self-pay | Admitting: Podiatry

## 2015-08-04 ENCOUNTER — Ambulatory Visit (INDEPENDENT_AMBULATORY_CARE_PROVIDER_SITE_OTHER): Payer: BLUE CROSS/BLUE SHIELD

## 2015-08-04 ENCOUNTER — Ambulatory Visit (INDEPENDENT_AMBULATORY_CARE_PROVIDER_SITE_OTHER): Payer: BLUE CROSS/BLUE SHIELD | Admitting: Podiatry

## 2015-08-04 VITALS — BP 103/54 | HR 72 | Resp 12

## 2015-08-04 DIAGNOSIS — S92301K Fracture of unspecified metatarsal bone(s), right foot, subsequent encounter for fracture with nonunion: Secondary | ICD-10-CM

## 2015-08-04 DIAGNOSIS — Z9889 Other specified postprocedural states: Secondary | ICD-10-CM

## 2015-08-04 MED ORDER — OXYCODONE-ACETAMINOPHEN 10-325 MG PO TABS: 1.0000 | ORAL_TABLET | ORAL | Status: DC | PRN

## 2015-08-05 ENCOUNTER — Other Ambulatory Visit: Payer: Self-pay

## 2015-08-05 NOTE — Telephone Encounter (Signed)
Entered in error

## 2015-08-07 NOTE — Progress Notes (Signed)
Subjective:     Patient ID: Charles Wells, male   DOB: 1956/08/11, 59 y.o.   MRN: [id number]  HPI patient states I'm doing pretty well with minimal discomfort and I'm able to keep this pain under control with elevation.   Review of Systems     Objective:   Physical Exam Neurovascular status was found to be intact with negative Homans sign noted and patient's noted to have well-healing surgical site dorsum right foot and posterior lateral aspect of right heel secondary to repair of fractures along with calcaneal graft. Patient has staples that are in place is noted to have good digital perfusion and appears to be doing well at this time    Assessment:     Healing well for extensive surgery right foot and heel bone    Plan:     Reviewed x-rays and advised on continued complete immobilization and nonweightbearing and to monitor for any indications of phlebitis or other abnormal signs. Continue elevation reappoint 2 weeks or earlier if any issues should occur

## 2015-08-16 ENCOUNTER — Telehealth: Payer: Self-pay | Admitting: *Deleted

## 2015-08-16 MED ORDER — OXYCODONE-ACETAMINOPHEN 10-325 MG PO TABS: 1.0000 | ORAL_TABLET | ORAL | Status: DC | PRN

## 2015-08-16 NOTE — Telephone Encounter (Signed)
Pt states he needs a refill of his pain medication.  Dr. Paulla Dolly states refill Percocet as previously.  I informed pt he would need to pick the medication up in the [place] office, pt state he was told the rx could be faxed.  I told pt he would have to pick the rx up in the [place] office.

## 2015-08-22 ENCOUNTER — Ambulatory Visit (INDEPENDENT_AMBULATORY_CARE_PROVIDER_SITE_OTHER): Payer: BLUE CROSS/BLUE SHIELD | Admitting: Podiatry

## 2015-08-22 ENCOUNTER — Ambulatory Visit: Payer: Self-pay

## 2015-08-22 DIAGNOSIS — Z9889 Other specified postprocedural states: Secondary | ICD-10-CM

## 2015-08-22 DIAGNOSIS — M8430XG Stress fracture, unspecified site, subsequent encounter for fracture with delayed healing: Secondary | ICD-10-CM

## 2015-08-23 NOTE — Progress Notes (Signed)
Subjective:     Patient ID: Charles Wells, male   DOB: 03-26-1956, 59 y.o.   MRN: [id number]  HPI patient presents stating I'm doing fine and ready to get staples and stitches out   Review of Systems     Objective:   Physical Exam Neurovascular status intact muscle strength adequate negative Homans sign was noted with well-healed surgical sites dorsum of the right foot and posterior lateral aspect right foot where staples are in place and there is no drainage noted with minimal edema    Assessment:     Doing well post surgical procedure dorsal right and calcaneal graft right    Plan:     Staples removed stitches removed wound edges coapted well applied sterile dressing advised on continued elevation gradual slow weightbearing at this time and gradual increase in activity levels. Patient will still not do completely weightbearing at this time and is encouraged to call with any questions but if not will be seen back in 2 weeks for reevaluation

## 2015-08-25 ENCOUNTER — Telehealth: Payer: Self-pay | Admitting: *Deleted

## 2015-08-25 MED ORDER — OXYCODONE-ACETAMINOPHEN 10-325 MG PO TABS: 1.0000 | ORAL_TABLET | ORAL | Status: DC | PRN

## 2015-08-25 NOTE — Telephone Encounter (Signed)
Pt presents to office for refill of Percocet, only had a ride to office today.  Dr. Josephina Shih refill as previously.

## 2015-09-09 ENCOUNTER — Other Ambulatory Visit: Payer: BLUE CROSS/BLUE SHIELD

## 2015-09-12 ENCOUNTER — Ambulatory Visit (INDEPENDENT_AMBULATORY_CARE_PROVIDER_SITE_OTHER): Payer: BLUE CROSS/BLUE SHIELD

## 2015-09-12 ENCOUNTER — Ambulatory Visit (INDEPENDENT_AMBULATORY_CARE_PROVIDER_SITE_OTHER): Payer: BLUE CROSS/BLUE SHIELD | Admitting: Podiatry

## 2015-09-12 DIAGNOSIS — M8430XG Stress fracture, unspecified site, subsequent encounter for fracture with delayed healing: Secondary | ICD-10-CM

## 2015-09-12 DIAGNOSIS — Z9889 Other specified postprocedural states: Secondary | ICD-10-CM | POA: Diagnosis not present

## 2015-09-12 DIAGNOSIS — S92301K Fracture of unspecified metatarsal bone(s), right foot, subsequent encounter for fracture with nonunion: Secondary | ICD-10-CM

## 2015-09-12 MED ORDER — OXYCODONE-ACETAMINOPHEN 10-325 MG PO TABS: 1.0000 | ORAL_TABLET | ORAL | Status: DC | PRN

## 2015-09-12 NOTE — Progress Notes (Addendum)
  Subjective: Charles Wells is a 60 y.o. is seen today in office s/p right metatarsal 3 and 4 ORIF with calcaneal bone graft. He states that his pain has decreased, but asking for a refill of Percocet as he will be out before next appointment. He hoes change the dressing to the foot daily. Denies any surrounding erythema or drainage. Swelling has decreased.  Denies any systemic complaints such as fevers, chills, nausea, vomiting. No calf pain, chest pain, shortness of breath.   Objective: General: No acute distress, AAOx3  DP/PT pulses palpable 2/4, CRT < 3 sec to all digits.  Protective sensation intact. Motor function intact.  Right foot: Incision is well coapted without any evidence of dehiscence to both incisions. There is small amount of scab over the dorsal incision with a small amount of surrounding inflammation. No ascending cellulitis. There is no  fluctuance, crepitus, malodor, drainage/purulence. There is trace edema around the surgical site. There is mild but subjectively decreasing pain along the surgical site.  No other areas of tenderness to bilateral lower extremities.  No other open lesions or pre-ulcerative lesions.  No pain with calf compression, swelling, warmth, erythema.   Assessment and Plan:  Status post right foot surgery, doing well with no complications   -Treatment options discussed including all alternatives, risks, and complications -X-rays were obtained and reviewed with the patient.  -Ice/elevation -Pain medication as needed. Refilled percocet -Continue NWB; surgical shoe -Monitor for any clinical signs or symptoms of infection and DVT/PE and directed to call the office immediately should any occur or go to the ER. -Follow-up in 2 weeks or sooner if any problems arise. In the meantime, encouraged to call the office with any questions, concerns, change in symptoms.   Celesta Gentile, DPM  Addendum: This patient had a fracture of the metatarsal which went on to  a pseudoarthrosis and nonunion. Due to his continued pain he did undergo surgical intervention which involved resection of the nonunion in calcaneal bone graft. I do believe that he would still benefit from a bone stimulator given the nonunion.

## 2015-09-14 ENCOUNTER — Telehealth: Payer: Self-pay | Admitting: *Deleted

## 2015-09-14 NOTE — Telephone Encounter (Signed)
He should come in for an x-ray

## 2015-09-14 NOTE — Telephone Encounter (Addendum)
Lytle Michaels - requested 09/12/2015 clinical notes to continue processing for bone growth stimulator.  09/14/2015 - Pt states he got up last night to get an orange aide and stumbled over a table and the foot hurts a little more.  Informed pt Dr. Jacqualyn Posey wanted him to have a x-ray.  I told pt and he said he could get to [facility] easier than to [facility], I told him we would schedule to be seen by Dr. Cannon Kettle in [place], and transferred to [facility].

## 2015-09-15 ENCOUNTER — Encounter: Payer: Self-pay | Admitting: Sports Medicine

## 2015-09-15 ENCOUNTER — Ambulatory Visit (INDEPENDENT_AMBULATORY_CARE_PROVIDER_SITE_OTHER): Payer: BLUE CROSS/BLUE SHIELD | Admitting: Sports Medicine

## 2015-09-15 ENCOUNTER — Ambulatory Visit (INDEPENDENT_AMBULATORY_CARE_PROVIDER_SITE_OTHER): Payer: BLUE CROSS/BLUE SHIELD

## 2015-09-15 DIAGNOSIS — M79671 Pain in right foot: Secondary | ICD-10-CM

## 2015-09-15 DIAGNOSIS — Z9889 Other specified postprocedural states: Secondary | ICD-10-CM

## 2015-09-15 DIAGNOSIS — L02619 Cutaneous abscess of unspecified foot: Secondary | ICD-10-CM

## 2015-09-15 DIAGNOSIS — L03119 Cellulitis of unspecified part of limb: Secondary | ICD-10-CM

## 2015-09-15 MED ORDER — CEPHALEXIN 500 MG PO CAPS: 500.0000 mg | ORAL_CAPSULE | Freq: Three times a day (TID) | ORAL | Status: DC

## 2015-09-15 NOTE — Progress Notes (Signed)
Patient ID: Charles Wells, male   DOB: [birth year], 59 y.o.   MRN: [id number] Subjective: Charles Wells is a 59 y.o. male patient seen today in office for follow up evaluation, S/P Right ORIF 3-4 metatarsal with calcaneal bone graft. Patient reports that 2 days go tripped when got out of bed in middle of the night to get something to drink and hit his right foot and had pain to top of right foot and heel. Patient states that pain is getting better but he wanted to make sure nothing else was wrong especially since wife kept telling him to get his foot checked, denies calf pain, denies headache, chest pain, shortness of breath, admits to nausea since yesterday but denies vomiting, fever, or chills. No other issues noted.   Objective: General: No acute distress, AAOx3  Right foot: Incision sites coapted with no gapping or dehiscence, there is dry scab centrally with mild swelling and surrounding erythema no ascending cellulitis, , there is tenderness and warmth to the incisions, no drainage, no other signs of infection noted, Capillary fill time <3 seconds in all digits, gross sensation present via light touch to right foot. No pain or crepitation with range of motion right ankle or digits.  No pain with calf compression.   Post Op Xray, Right foot:No acute changes as compared to xrays on 09-12-15, Hardware intact. Soft tissue swelling within normal limits for post op status.   Assessment and Plan:  Problem List Items Addressed This Visit    None    Visit Diagnoses    Right foot pain    -  Primary    Relevant Orders    DG Foot 2 Views Right    Status post right foot surgery        ORIF 3-4 met with calcaneal bone graft    Relevant Medications    cephALEXin (KEFLEX) 500 MG capsule    Cellulitis and abscess of foot        surrounding incisions with tenderness, warmth, and swelling     Relevant Medications    cephALEXin (KEFLEX) 500 MG capsule       -Patient seen and evaluated -Xrays reviewed  and discussed with patient  -Rx Keflex 528m for concerning redness, warmth, and tenderness at incisions to prevent infection -Covered right foot with stockinet. Advised daily monitoring of incision sites; if worsens call office to come in immediately or go to ER.  -Advised patient to continue with strict NWB to right foot and ambulation assistance with rolling walker and protection of foot with post op shoe; Advised daily monitoring for symptoms of DVT/PE; if present to go to ER. -Advised patient to limit activity to necessity to prevent injury to right foot -Recommend cont with ice and elevation daily -Recommend cont with pain meds as needed  -Patient to follow up with Dr. WJacqualyn Poseyas scheduled or sooner if needed. In the meantime, patient to call office if any issues or problems arise.   TLandis Martins DPM

## 2015-09-28 ENCOUNTER — Ambulatory Visit (INDEPENDENT_AMBULATORY_CARE_PROVIDER_SITE_OTHER): Payer: BLUE CROSS/BLUE SHIELD

## 2015-09-28 ENCOUNTER — Ambulatory Visit (INDEPENDENT_AMBULATORY_CARE_PROVIDER_SITE_OTHER): Payer: BLUE CROSS/BLUE SHIELD | Admitting: Podiatry

## 2015-09-28 ENCOUNTER — Encounter: Payer: Self-pay | Admitting: Podiatry

## 2015-09-28 VITALS — BP 144/90 | HR 90 | Resp 16

## 2015-09-28 DIAGNOSIS — Z9889 Other specified postprocedural states: Secondary | ICD-10-CM

## 2015-09-28 DIAGNOSIS — M8430XG Stress fracture, unspecified site, subsequent encounter for fracture with delayed healing: Secondary | ICD-10-CM

## 2015-09-28 DIAGNOSIS — R600 Localized edema: Secondary | ICD-10-CM

## 2015-09-28 MED ORDER — OXYCODONE-ACETAMINOPHEN 10-325 MG PO TABS: 1.0000 | ORAL_TABLET | ORAL | Status: DC | PRN

## 2015-09-29 ENCOUNTER — Other Ambulatory Visit: Payer: BLUE CROSS/BLUE SHIELD

## 2015-10-03 NOTE — Progress Notes (Signed)
Subjective:     Patient ID: Charles Wells, male   DOB: 08/10/1956, 60 y.o.   MRN: [id number]  HPI patient states I'm doing well with my right foot with swelling still noted if him on it too long but pain that seems to be reducing quite a bit   Review of Systems     Objective:   Physical Exam  neurovascular status intact with well-healing surgical site dorsum right foot and right heel with moderate edema +1 pitting in the foot and ankle and negative Homans sign noted. Discomfort is still present when palpated deeply but it appears to be much better than previously    Assessment:      patient's doing well post foot reconstruction    Plan:      H&P x-rays reviewed and at this time I advised on continued immobilization and gradual weightbearing but very slowly with continued nonweightbearing for most the time. I did apply an Unna boot to try to reduce the swelling present and advised on continued elevation

## 2015-10-17 ENCOUNTER — Telehealth: Payer: Self-pay | Admitting: *Deleted

## 2015-10-17 NOTE — Telephone Encounter (Addendum)
Pt states Dr. Paulla Dolly told him if he needed the wrap put back on to call.  11/08/2015-PT [place] was delivered to his home and he was not aware he still needed it and is not going to sign for it.  Pt states company stated he should have gotten a letter stating there was not charge for him.  Pt would like to know if he still needs the bone growth stimulator.  11/09/2015-PT'S WIFE, BARBARA brought Exogen Bone Growth Stimulator to the office and ask to have fitted on pt if it was still necessary.  I told Pamala Hurry, I would ask Dr. Paulla Dolly and call her with necessity.  DR. [place] FINE, Exogen is not needed.  I spoke with Lytle Michaels - [employer] rep and he said Dr. Jacqualyn Posey had assisted in the Fall 2016 surgery, and ordered the Exogen, so would need to be discontinued by him.  DR. McLeod he will talk to [place].  I left message on Barbara's phone, with "Cannon Kettle", David's friend to have her call me for instruction.  Left message on [phone] pt's phone to call my office.  I will tell pt to stop the Exogen and to bring to the office as soon as he could.  PT STATES THE [employer] paperwork states there is no charge for the machine and he is going to keep it.  Pt also stated very loudly that Dr. Paulla Dolly said he could come into the office any time and get one of those cool dressings.  I told pt I would inform Dr. Paulla Dolly and if he ordered the"cool" dressing prn, I would make a note and the receptionist and schedulers would be made aware.  11/09/2014-J. Clayton SPOKE with pt and he is to see her when pt comes in as a walk-in.

## 2015-10-18 ENCOUNTER — Ambulatory Visit (INDEPENDENT_AMBULATORY_CARE_PROVIDER_SITE_OTHER): Payer: BLUE CROSS/BLUE SHIELD

## 2015-10-18 DIAGNOSIS — R600 Localized edema: Secondary | ICD-10-CM | POA: Diagnosis not present

## 2015-10-18 DIAGNOSIS — Z9889 Other specified postprocedural states: Secondary | ICD-10-CM | POA: Diagnosis not present

## 2015-10-18 MED ORDER — OXYCODONE-ACETAMINOPHEN 10-325 MG PO TABS: 1.0000 | ORAL_TABLET | ORAL | Status: DC | PRN

## 2015-10-18 NOTE — Progress Notes (Signed)
Patient ID: Charles Wells, male   DOB: Mar 05, 1956, 60 y.o.   MRN: [id number] Pt presents for application of unna boot. Right foot edematous but incision closed. Slight redness to suture line, no drainage no other s/s of infection. Unna boot applied with compression wrap, pt to keep follow up appt with Dr Paulla Dolly, call if any acute changes occur

## 2015-10-26 ENCOUNTER — Ambulatory Visit (INDEPENDENT_AMBULATORY_CARE_PROVIDER_SITE_OTHER): Payer: BLUE CROSS/BLUE SHIELD

## 2015-10-26 ENCOUNTER — Ambulatory Visit (INDEPENDENT_AMBULATORY_CARE_PROVIDER_SITE_OTHER): Payer: BLUE CROSS/BLUE SHIELD | Admitting: Podiatry

## 2015-10-26 DIAGNOSIS — Z9889 Other specified postprocedural states: Secondary | ICD-10-CM

## 2015-10-26 DIAGNOSIS — R600 Localized edema: Secondary | ICD-10-CM | POA: Diagnosis not present

## 2015-10-26 NOTE — Progress Notes (Signed)
Subjective:     Patient ID: Charles Wells, male   DOB: 1956-02-19, 60 y.o.   MRN: [id number]  HPI patient states I'm getting some generalized pain in my foot and swelling but it seems to gradually getting better and it's much less sharp than it was previously. It occurs at different places   Review of Systems     Objective:   Physical Exam  neurovascular status intact no change in health history with well-healed surgical site anterior of the right foot and in the right calcaneus with moderate edema noted in the forefoot right localized in nature    Assessment:      gradual improvement of foot with continued edematous process    Plan:      reviewed x-rays that healing is still occurring and continue with immobilization but can walk on the foot and I did applied Unna boot today with Ace wrap with instructions on usage

## 2015-10-27 ENCOUNTER — Other Ambulatory Visit: Payer: BLUE CROSS/BLUE SHIELD

## 2015-11-09 ENCOUNTER — Telehealth: Payer: Self-pay

## 2015-11-09 NOTE — Telephone Encounter (Signed)
I have not seen the patient. If Dr. Paulla Dolly says he does not need it, please d/c the order. Thanks.

## 2015-12-08 ENCOUNTER — Ambulatory Visit (INDEPENDENT_AMBULATORY_CARE_PROVIDER_SITE_OTHER): Payer: BLUE CROSS/BLUE SHIELD

## 2015-12-08 ENCOUNTER — Ambulatory Visit (INDEPENDENT_AMBULATORY_CARE_PROVIDER_SITE_OTHER): Payer: BLUE CROSS/BLUE SHIELD | Admitting: Podiatry

## 2015-12-08 ENCOUNTER — Encounter: Payer: Self-pay | Admitting: Podiatry

## 2015-12-08 DIAGNOSIS — Z9889 Other specified postprocedural states: Secondary | ICD-10-CM

## 2015-12-08 DIAGNOSIS — S92301K Fracture of unspecified metatarsal bone(s), right foot, subsequent encounter for fracture with nonunion: Secondary | ICD-10-CM | POA: Diagnosis not present

## 2015-12-13 NOTE — Progress Notes (Signed)
Subjective:     Patient ID: Charles Wells, male   DOB: 27-Oct-1955, 60 y.o.   MRN: [id number]  HPI patient states the swelling seems to gradually be getting better but he still has discomfort in his right foot and still is using one crutch   Review of Systems     Objective:   Physical Exam Neurovascular status was intact with significant reduction of edema in the right forefoot with incision sites that's healing very well with wound edges well coapted and no drainage. There is discomfort when I pressed deeply but is improving quite a bit and there is still lateral pain away from where we operated    Assessment:     Improvement is occurring in the right foot but still has pain secondary to the complexity of the procedure healing mechanisms and obesity that the patient has    Plan:     H&P and x-rays reviewed with patient. I did discuss that healing appears to be occurring but it's difficult to make complete determination but at this point due to a reduction of edema reduction of discomfort that appears healing is gradually occurring. I did explain this will take 6 months to one year for all the edema to reduce and for full healing to occur with the possibility he will still have pain given his preoperative symptomatology  X-ray report indicates that the plates are in place and it appears that healing is occurring in the second and third metatarsals with still some haziness that I'm expecting to continue to calcify over the near future. He still does have healing to go and I explained that to him

## 2016-02-09 ENCOUNTER — Telehealth: Payer: Self-pay | Admitting: *Deleted

## 2016-02-09 ENCOUNTER — Encounter: Payer: Self-pay | Admitting: Podiatry

## 2016-02-09 ENCOUNTER — Ambulatory Visit (INDEPENDENT_AMBULATORY_CARE_PROVIDER_SITE_OTHER): Payer: BLUE CROSS/BLUE SHIELD | Admitting: Podiatry

## 2016-02-09 ENCOUNTER — Ambulatory Visit (INDEPENDENT_AMBULATORY_CARE_PROVIDER_SITE_OTHER): Payer: BLUE CROSS/BLUE SHIELD

## 2016-02-09 VITALS — BP 138/83 | HR 101 | Resp 16

## 2016-02-09 DIAGNOSIS — Z9889 Other specified postprocedural states: Secondary | ICD-10-CM

## 2016-02-09 DIAGNOSIS — S92301K Fracture of unspecified metatarsal bone(s), right foot, subsequent encounter for fracture with nonunion: Secondary | ICD-10-CM

## 2016-02-09 DIAGNOSIS — M722 Plantar fascial fibromatosis: Secondary | ICD-10-CM

## 2016-02-09 MED ORDER — TRIAMCINOLONE ACETONIDE 10 MG/ML IJ SUSP: 10.0000 mg | Freq: Once | INTRAMUSCULAR | Status: DC

## 2016-02-09 NOTE — Progress Notes (Signed)
Subjective:     Patient ID: Charles Wells, male   DOB: 06-25-1956, 60 y.o.   MRN: [id number]  HPI patient presents stating that he is still having a lot of pain in his foot and he points mostly to the outside of the right foot and states he still gets a lot of swelling if she's on it for a period of time. 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 The area currently is not inflamed and there was no warmth noted in the incision site but there was quite a bit of discomfort in the lateral side of the foot. Patient also had quite a bit of pain in the plantar heel    Assessment:     Very difficult to understand why he continues to experience so much discomfort with concerned about the healing of the fracture sites that he had and the possibility for other pathology which may exist due to the discomfort that he is experience with ambulation    Plan:     I x-rayed his foot today and I'm sending him for CT scan to try to rule out that there may not be any other pathology occurring and I did inject the plantar heel 3 mg Kenalog 5 mg Xylocaine and gave him a new surgical shoe today. Patient will be seen back when we get results of CT scan to try to understand better why he continues to experience the pain he has to  Report indicates that the is in place and the length of the second and the third metatarsals are stable with what appears to be reasonably good healing but difficult to make complete determination as to the healing of the previous nonunion that had occurred from fracture

## 2016-02-09 NOTE — Telephone Encounter (Addendum)
Orders to [facility] to pre-cert.  [employer] [place] AUTHORIZED CT OF RIGHT FOOT, AUTHORIZATION ORDER # [id number], VALID 02/09/2016 TO 03/09/2016.  02/22/2016-Pt asked when he could schedule to get his CT results.  I transferred pt to schedulers.  02/28/2016-Pt called states the rep has not called him.  I reviewed 02/27/2016 Dr. Paulla Dolly clinical notes and pt was to begin the Exogen Bone Growth Stimulator.  I called Rondall Allegra and he states he has been speaking with Dr. Jacqualyn Posey concerning this pt, and he will call pt.  I gave Lytle Michaels pt's phone number. 03/02/2016-[employer] [place] APPROVED ZOHYDRO ER 10 MG, KEY: [id number], VALID 03/02/2016 - 09/23/2038.  INFORMED PT AND FAXED TO [facility] IN [facility]. 04/11/2016-Pt states he can not afford the antiinflammatory cream Dr. Paulla Dolly ordered. Pt called states he can not afford the pain medication cream Dr. Paulla Dolly ordered, because he is on a fixed income. I asked pt if the [facility] offered him the substitute pain cream and pt states he can't afford the $38.00 and he's been suffering with this since his surgery in November, and he's getting agitated and he wants to know what Dr. Paulla Dolly is going to do about it. 04/12/2016-Dr. Regal ordered [employer]armazen Diclofenac 1% Gel. Orders to [facility]. In addition to the Diclofenac gel, Dr. Paulla Dolly wanted pt to apply either Ortho-nesic Pain-Relieving Gel or Biofreeze 3-4 times daily in between the dosing of the Diclofenac gel, in case the Diclofenac gel is not covered by the insurance pt is to use one or the other of these medications at a time 3-4 times daily. I informed pt of the new medication from [facility], and the additional pain medications from our office at no charge per Dr. Paulla Dolly. Mailed to pt.

## 2016-02-15 ENCOUNTER — Ambulatory Visit
Admission: RE | Admit: 2016-02-15 | Discharge: 2016-02-15 | Disposition: A | Discharge status: Home / Self Care | Payer: BLUE CROSS/BLUE SHIELD | Source: Ambulatory Visit | Attending: Podiatry | Admitting: Podiatry

## 2016-02-15 DIAGNOSIS — S92301K Fracture of unspecified metatarsal bone(s), right foot, subsequent encounter for fracture with nonunion: Secondary | ICD-10-CM

## 2016-02-15 DIAGNOSIS — M722 Plantar fascial fibromatosis: Secondary | ICD-10-CM

## 2016-02-15 DIAGNOSIS — Z9889 Other specified postprocedural states: Secondary | ICD-10-CM

## 2016-02-15 IMAGING — CT CT FOOT*R* W/O CM
4 of 5 series · 11 of 20 positions shown, 12 images · non-contrast
Comparison: 03/29/2015

CLINICAL DATA: Pain and swelling of the right foot.

EXAM:
CT OF THE RIGHT FOOT WITHOUT CONTRAST
TECHNIQUE: Multidetector CT imaging of the right foot was performed according
to the standard protocol. Multiplanar CT image reconstructions were
also generated.

[Series 4: soft tissue lower extremity (person_name) · axial · 0.56mm/px · z∈[-204,-144]mm · 3 of 62 slices shown]
[im 16/62  soft-tissue]
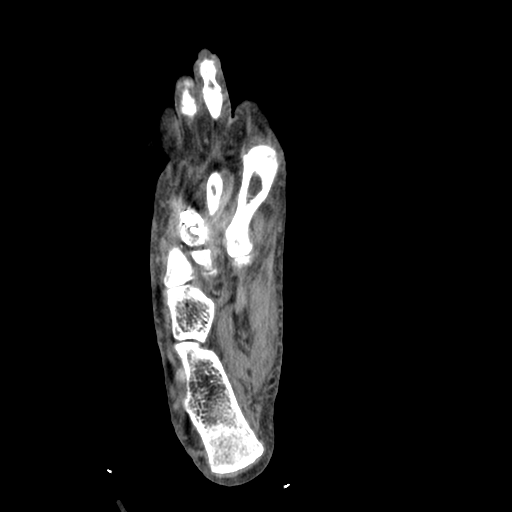
[im 31/62  soft-tissue]
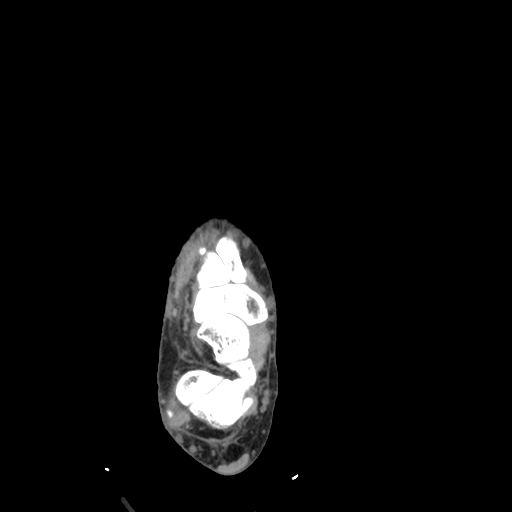
[im 46/62  soft-tissue]
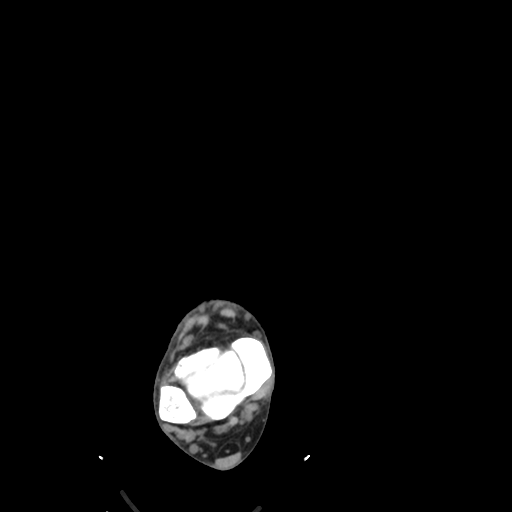

[Series 602: cor bone · axial · 0.56mm/px · z∈[-304,-245]mm · 3 of 66 slices shown, 4 images]
[im 17/66  soft-tissue]
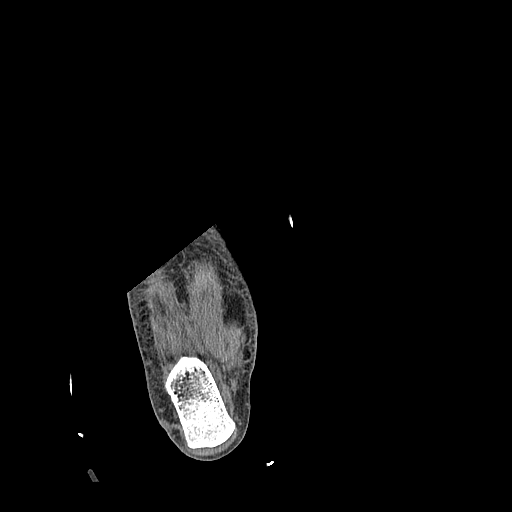
[im 17/66  bone]
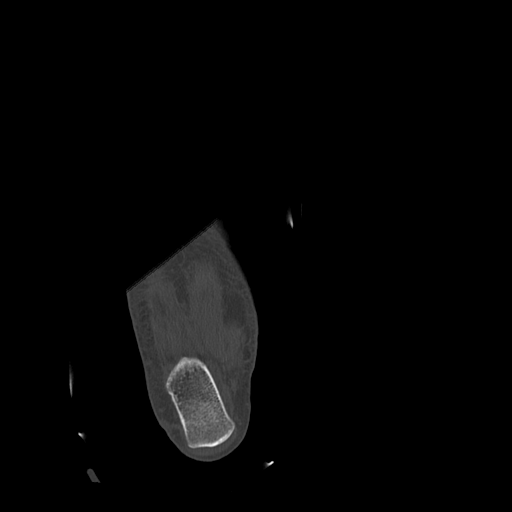
[im 33/66  bone]
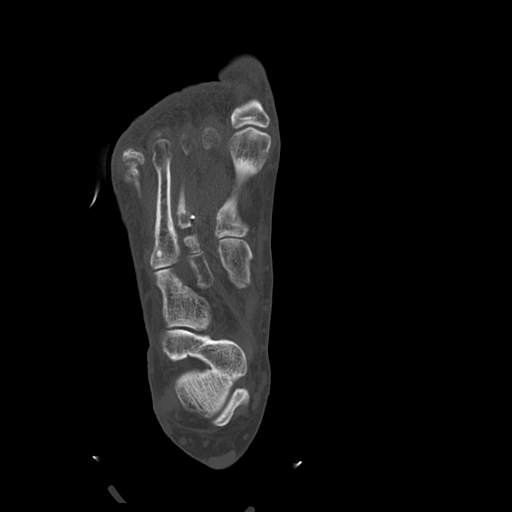
[im 49/66  bone]
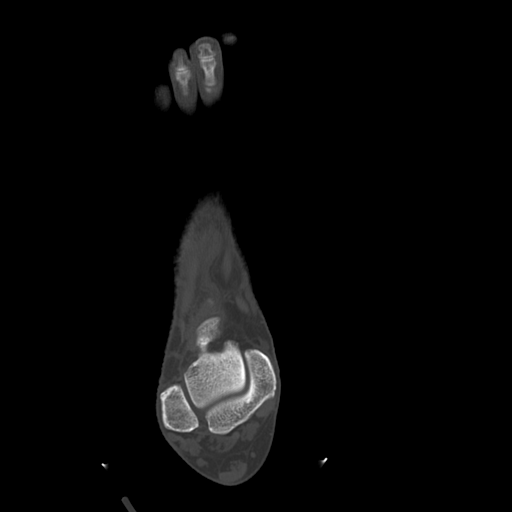

[Series 603: axial bone · coronal · 0.56mm/px · 3 of 116 slices shown]
[im 47/116  bone]
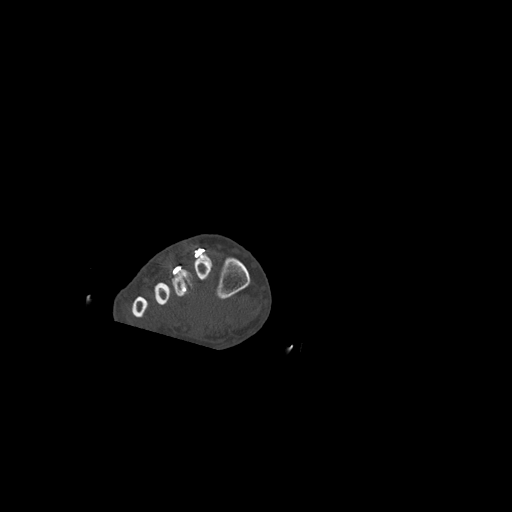
[im 81/116  bone]
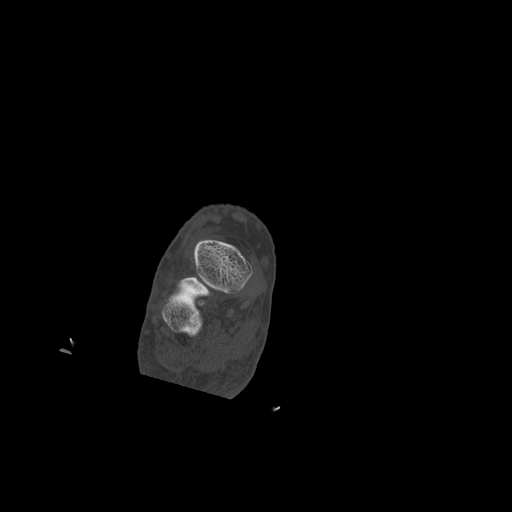
[im 115/116  bone]
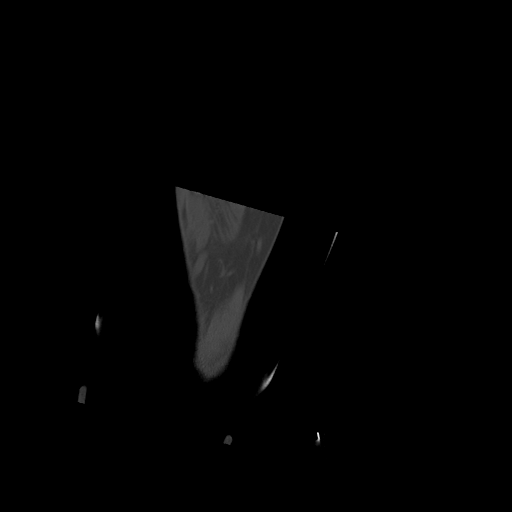

[Series 605: cor soft · axial · 0.56mm/px · z∈[-263,-225]mm · 2 of 60 slices shown]
[im 20/60  soft-tissue]
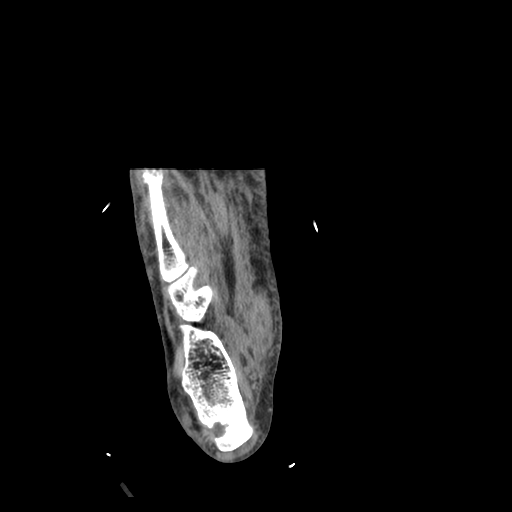
[im 40/60  soft-tissue]
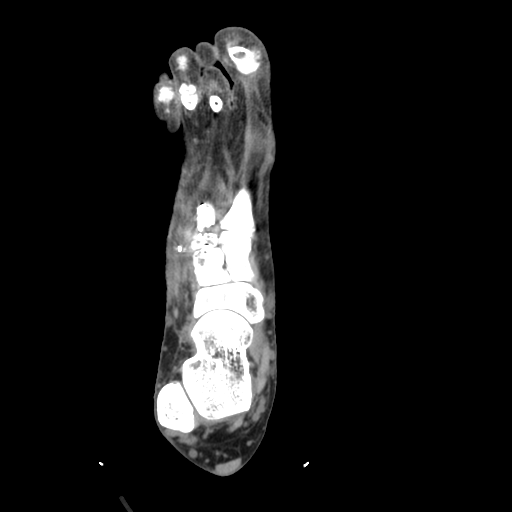

[11 of 20 positions shown; findings below may reference images not displayed]

FINDINGS: Bones/Joint/Cartilage

Bone defect in the posterior calcaneus from prior bone harvesting.
Ununited fracture of the base of the second metatarsal transfixed
with a dorsal side plate and interlocking screws. Ununited
comminuted fracture of the base of the third metatarsal transfixed
with a sideplate and multiple interlocking screws.

No acute fracture or dislocation. Normal ankle mortise. Normal
subtalar joints. Normal sinus tarsi. Normal alignment. No joint
effusion.

Tendons
Flexor, extensor, peroneal and Achilles tendons are grossly intact.
Plantar fascia is grossly intact.

Muscles

Normal.

Soft tissue
No fluid collection or hematoma. Mild soft tissue swelling along the
dorsal aspect of the midfoot overlying the second and third
metatarsals.
IMPRESSION: 1. Ununited fracture of the base of the second metatarsal transfixed
with a dorsal side plate and interlocking screws.
2. Ununited comminuted fracture of the base of the third metatarsal
transfixed with a sideplate and multiple interlocking screws.

## 2016-02-27 ENCOUNTER — Encounter: Payer: Self-pay | Admitting: Podiatry

## 2016-02-27 ENCOUNTER — Ambulatory Visit (INDEPENDENT_AMBULATORY_CARE_PROVIDER_SITE_OTHER): Payer: BLUE CROSS/BLUE SHIELD | Admitting: Podiatry

## 2016-02-27 DIAGNOSIS — Z9889 Other specified postprocedural states: Secondary | ICD-10-CM

## 2016-02-27 DIAGNOSIS — S92301K Fracture of unspecified metatarsal bone(s), right foot, subsequent encounter for fracture with nonunion: Secondary | ICD-10-CM

## 2016-02-27 MED ORDER — HYDROCODONE BITARTRATE 10 MG PO C12A
1.0000 | EXTENDED_RELEASE_CAPSULE | Freq: Two times a day (BID) | ORAL | Status: DC
Start: 2016-02-27 — End: 2016-05-07

## 2016-02-27 NOTE — Progress Notes (Signed)
Subjective:     Patient ID: Charles Wells, male   DOB: 01/02/1956, 60 y.o.   MRN: [id number]  HPI patient states the foot still hurts but it seems to be more on the outside of the foot and I heel feels better after you gave me the injection but I know I'm not putting Michael foot down on the ground   Review of Systems     Objective:   Physical Exam 7 months after having repair of fractured metatarsals right second and third with patient's pain currently more on the lateral side of the foot. When palpating I did not note any pain around the second metatarsal moderate pain around the third metatarsal will quite a bit of discomfort around the fourth and fifth metatarsal cuneiform joint. Patient does appear to be compensated with his gait and does not have the confidence to put the medial side of his foot and admits that he has been walking barefoot which I had expressly told him not to around the house    Assessment:     Second and third metatarsals fractures which  still have not consolidated from previous surgery along with pain in the lateral side of the foot which may be primary or secondary in nature    Plan:     I reviewed CT scan with him and his wife indicating incomplete fractures of the second third metatarsals with some gapping noted and we reviewed bone stimulator and he will start with today. Also he will continue with his vitamin D and I'm getting get the results of his last blood work and I dispensed an air fracture walker that he needs to wear at all times now and I again have forbid him from going barefoot which he has not been compliant in doing at home. I did discuss that there is a possibility in the future that were given need to go back in removed fixation and attempt to re-infuse the bone but that this is not something we are going to  consider at this time. Reviewed this difficult case with Dr. Jacqualyn Posey who agrees with me on the treatment plan. We started by codeine today  without acetaminophen to continue to control the pain he is experiencing

## 2016-02-29 ENCOUNTER — Telehealth: Payer: Self-pay | Admitting: Podiatry

## 2016-02-29 NOTE — Telephone Encounter (Signed)
Pt called and can get the vicodin with out tylenol filled at [facility] in [place] but it cost 574.00 unless we get authorizaton for it. Pt asked if you could get authorization from the insurance company for him please.

## 2016-04-04 DIAGNOSIS — J309 Allergic rhinitis, unspecified: Secondary | ICD-10-CM | POA: Diagnosis not present

## 2016-04-09 ENCOUNTER — Encounter: Payer: Self-pay | Admitting: Podiatry

## 2016-04-09 ENCOUNTER — Ambulatory Visit (INDEPENDENT_AMBULATORY_CARE_PROVIDER_SITE_OTHER): Payer: PPO | Admitting: Podiatry

## 2016-04-09 ENCOUNTER — Ambulatory Visit (INDEPENDENT_AMBULATORY_CARE_PROVIDER_SITE_OTHER): Payer: PPO

## 2016-04-09 DIAGNOSIS — Z9889 Other specified postprocedural states: Secondary | ICD-10-CM | POA: Diagnosis not present

## 2016-04-09 NOTE — Progress Notes (Signed)
   Subjective:    Patient ID: Charles Wells, male    DOB: 01-Feb-1956, 60 y.o.   MRN: [id number]  HPI "It ain't!  It has popped again.  I been using that ultrasound thing every day.  I'm about ready to go somewhere else.  I am so ill I can't stand myself."    Review of Systems     Objective:   Physical Exam        Assessment & Plan:

## 2016-04-09 NOTE — Progress Notes (Signed)
Subjective:     Patient ID: Charles Wells, male   DOB: 01/09/1956, 60 y.o.   MRN: [id number]  HPI patient presents stating I'm still having pain but it seems to be more on the outside of the foot than the inside of the foot. The incision has healed well and the swelling is reducing but I'm still not able to do the things I want to do. Patient did state that he was walking at home and felt a pop last week. Patient states he has been using his bone stimulator as instructed. Also is wearing his boot at this time   Review of Systems     Objective:   Physical Exam Neurovascular status was found to be intact with negative Homans sign noted. Patient's noted to have a well-healed surgical site on the dorsum of the right foot and upon palpation has no pain on palpation to the second metatarsal shaft with moderate discomfort in the third metatarsal shaft. At this time more of his pain seems to be in the outside of his foot around the peroneal tertius group and base of the fifth metatarsal. Patient has no current heel pain and had good range of motion with some reduced motion of the digits plantarflexion and dorsiflexion but no problems with subtalar midtarsal joint    Assessment:     Appears to be slow healing third metatarsal shaft reconstruction of fracture with bone graft with second metatarsal doing much better with no discomfort currently and lateral tendinitis which may be primary condition or could be secondary to gait change    Plan:     Appears that the patient is making slow progress but does seem to upon palpation have less discomfort dorsally with less edema and more pain to the outside which again can be compensatory. He is moderately obese which is a complicating factor and did have this fracture for 1 year prior to our repair and he knew going in that this was given a be a difficult and long process. At this time I have recommended that we continue with the bone stimulator and I dispensed a  short air fracture walker which I think will be easier for him to walk in and he will continue to use 1 crutch like he is doing now. Patient understands that it is possible that at one point we will need to go back in and remove the fixation and possibly revise the third metatarsal. We are hopefully will heal independently on his own and he may require eventual steroid injection to the lateral side of the foot to help reduce inflammation. Discussed fracture the distal screw second metatarsal which is not bothering him and it may have been the injury that he experienced at home. States that he does not require pain medicine at this time  X-ray report was reviewed by me and also Dr. Jacqualyn Posey and more both in agreement that appears that there is some slow healing around the third metatarsal and we are hopeful that we'll eventually heal full and at the second metatarsal appears to be healing uneventfully. We did note the distal screw on the second metatarsal has fractured but it does not appear to be causing any problems for the patient and we do not need to go after this and less it were to become a problem

## 2016-04-10 MED ORDER — NONFORMULARY OR COMPOUNDED ITEM: Status: DC

## 2016-04-10 NOTE — Addendum Note (Signed)
Addended by: Harriett Sine D on: 04/10/2016 08:32 AM   Modules accepted: Orders

## 2016-04-11 ENCOUNTER — Telehealth: Payer: Self-pay | Admitting: *Deleted

## 2016-04-11 DIAGNOSIS — J309 Allergic rhinitis, unspecified: Secondary | ICD-10-CM | POA: Diagnosis not present

## 2016-04-11 NOTE — Telephone Encounter (Signed)
Entered in error

## 2016-04-12 ENCOUNTER — Encounter: Payer: Self-pay | Admitting: *Deleted

## 2016-04-12 ENCOUNTER — Telehealth: Payer: Self-pay | Admitting: *Deleted

## 2016-04-12 DIAGNOSIS — M722 Plantar fascial fibromatosis: Secondary | ICD-10-CM

## 2016-04-12 MED ORDER — NONFORMULARY OR COMPOUNDED ITEM: Status: DC

## 2016-04-12 NOTE — Telephone Encounter (Signed)
Entered in error

## 2016-04-13 DIAGNOSIS — J449 Chronic obstructive pulmonary disease, unspecified: Secondary | ICD-10-CM | POA: Diagnosis not present

## 2016-04-13 DIAGNOSIS — J45909 Unspecified asthma, uncomplicated: Secondary | ICD-10-CM | POA: Diagnosis not present

## 2016-04-13 DIAGNOSIS — G4733 Obstructive sleep apnea (adult) (pediatric): Secondary | ICD-10-CM | POA: Diagnosis not present

## 2016-04-13 DIAGNOSIS — J439 Emphysema, unspecified: Secondary | ICD-10-CM | POA: Diagnosis not present

## 2016-04-18 ENCOUNTER — Encounter: Payer: Self-pay | Admitting: Podiatry

## 2016-04-18 DIAGNOSIS — J309 Allergic rhinitis, unspecified: Secondary | ICD-10-CM | POA: Diagnosis not present

## 2016-04-20 DIAGNOSIS — E782 Mixed hyperlipidemia: Secondary | ICD-10-CM | POA: Diagnosis not present

## 2016-04-20 DIAGNOSIS — R0602 Shortness of breath: Secondary | ICD-10-CM | POA: Diagnosis not present

## 2016-04-20 DIAGNOSIS — Z79899 Other long term (current) drug therapy: Secondary | ICD-10-CM | POA: Diagnosis not present

## 2016-04-20 DIAGNOSIS — M545 Low back pain: Secondary | ICD-10-CM | POA: Diagnosis not present

## 2016-04-20 DIAGNOSIS — N5201 Erectile dysfunction due to arterial insufficiency: Secondary | ICD-10-CM | POA: Diagnosis not present

## 2016-05-02 ENCOUNTER — Telehealth: Payer: Self-pay

## 2016-05-02 DIAGNOSIS — J309 Allergic rhinitis, unspecified: Secondary | ICD-10-CM | POA: Diagnosis not present

## 2016-05-02 NOTE — Telephone Encounter (Signed)
Spoke with pt regarding his foot pain in his Rt foot. He stated that his foot continues to swell and is painful. Stated that he was more active over the weekend and noticed and increase in pain and swelling from activity increase. He since then has rested, and elevated his foot, but states his pain is on the outside of his Rt foot, not where the surgery was done. He said he was using all topicals, bone stim and taking prn pain meds. I advised his to continue with those therapies, after consulting with Dr Paulla Dolly, I left a vm telling him of Dr Mellody Drown orders: Continue with current therapies, come into our office at his earliest convenience for unna boot application, to aid in swelling reduction, to keep his appt for this Monday and to keep unna boot in place until Monday morning. He is to call with any acute status changes or with questions or concerns.

## 2016-05-03 NOTE — Progress Notes (Signed)
Pt came in to office today for pain and swelling in Rt foot and for application of unna boot. Examination revealed mild swelling mid foot and pain on palpation of lateral and midfoot. Pt states that swelling has improved over past few days. No edema, no warmth, redness or open areas noted. He admits to taking pain medication 3-4 times daily to manage his pain. Using other therapies as instructed. He is instructed to keep unna boot on until the morning of his appt and to keep his appt for this Monday. Any acute symptom changes should be reported.

## 2016-05-07 ENCOUNTER — Ambulatory Visit (INDEPENDENT_AMBULATORY_CARE_PROVIDER_SITE_OTHER): Payer: PPO | Admitting: Podiatry

## 2016-05-07 ENCOUNTER — Encounter: Payer: Self-pay | Admitting: Podiatry

## 2016-05-07 ENCOUNTER — Ambulatory Visit (INDEPENDENT_AMBULATORY_CARE_PROVIDER_SITE_OTHER): Payer: PPO

## 2016-05-07 VITALS — BP 104/67 | HR 95 | Resp 16

## 2016-05-07 DIAGNOSIS — S92301K Fracture of unspecified metatarsal bone(s), right foot, subsequent encounter for fracture with nonunion: Secondary | ICD-10-CM

## 2016-05-07 DIAGNOSIS — Z9889 Other specified postprocedural states: Secondary | ICD-10-CM

## 2016-05-07 NOTE — Patient Instructions (Signed)
Pre-Operative Instructions  Congratulations, you have decided to take an important step to improving your quality of life.  You can be assured that the doctors of [facility] will be with you every step of the way.  1. Plan to be at the surgery center/hospital at least 1 (one) hour prior to your scheduled time unless otherwise directed by the surgical center/hospital staff.  You must have a responsible adult accompany you, remain during the surgery and drive you home.  Make sure you have directions to the surgical center/hospital and know how to get there on time. 2. For hospital based surgery you will need to obtain a history and physical form from your family physician within 1 month prior to the date of surgery- we will give you a form for you primary physician.  3. We make every effort to accommodate the date you request for surgery.  There are however, times where surgery dates or times have to be moved.  We will contact you as soon as possible if a change in schedule is required.   4. No Aspirin/Ibuprofen for one week before surgery.  If you are on aspirin, any non-steroidal anti-inflammatory medications (Mobic, Aleve, Ibuprofen) you should stop taking it 7 days prior to your surgery.  You make take Tylenol  For pain prior to surgery.  5. Medications- If you are taking daily heart and blood pressure medications, seizure, reflux, allergy, asthma, anxiety, pain or diabetes medications, make sure the surgery center/hospital is aware before the day of surgery so they may notify you which medications to take or avoid the day of surgery. 6. No food or drink after midnight the night before surgery unless directed otherwise by surgical center/hospital staff. 7. No alcoholic beverages 24 hours prior to surgery.  No smoking 24 hours prior to or 24 hours after surgery. 8. Wear loose pants or shorts- loose enough to fit over bandages, boots, and casts. 9. No slip on shoes, sneakers are best. 10. Bring  your boot with you to the surgery center/hospital.  Also bring crutches or a walker if your physician has prescribed it for you.  If you do not have this equipment, it will be provided for you after surgery. 11. If you have not been contracted by the surgery center/hospital by the day before your surgery, call to confirm the date and time of your surgery. 12. Leave-time from work may vary depending on the type of surgery you have.  Appropriate arrangements should be made prior to surgery with your employer. 13. Prescriptions will be provided immediately following surgery by your doctor.  Have these filled as soon as possible after surgery and take the medication as directed. 14. Remove nail polish on the operative foot. 15. Wash the night before surgery.  The night before surgery wash the foot and leg well with the antibacterial soap provided and water paying special attention to beneath the toenails and in between the toes.  Rinse thoroughly with water and dry well with a towel.  Perform this wash unless told not to do so by your physician.  Enclosed: 1 Ice pack (please put in freezer the night before surgery)   1 Hibiclens skin cleaner   Pre-op Instructions  If you have any questions regarding the instructions, do not hesitate to call our office.  [place]: [address] [place], [place] [address] [phone]  [place]: [address] [place], [place] [address] [phone]  [place]: [address]  [place], [place] [address] [phone]   Dr.   Kenwood Rosiak DPM, Dr. Matthew Wagoner DPM, Dr. M. Todd Hyatt DPM, Dr. Titorya Stover DPM 

## 2016-05-08 NOTE — Progress Notes (Signed)
Subjective:     Patient ID: Charles Wells, male   DOB: Jan 25, 1956, 60 y.o.   MRN: [id number]  HPI patient states he is still been experiencing pain and swelling if she's on his foot to much and it hurts in the middle and on the side. States he does not believe that the bone stimulator is helping   Review of Systems     Objective:   Physical Exam Neurovascular status was intact negative Homans sign was noted with well-healed surgical site dorsum of the midfoot right. Patient's medial foot is doing excellent and the second metatarsal has no pain and the third metatarsal is quite sore when palpated with currently no indication of prominence of the plate. 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 I reviewed this case with Dr. Amalia Hailey and Dr. Jacqualyn Posey and more all in agreement that the plate will have to be removed the area of nonunion cut out and freshened up and a new graft will have to be placed with a new plate. I discussed this with patient and he wants this done and at this time I allowed him to read a consent form discussing the risk of this procedure alternative treatments and complications. I explained at great length that there is absolutely no long-term guarantees that this will heal and that it we'll take her proximally 6 months to one year for complete healing and that we will use the bone stimulator right away after surgery and he will need to be nonweightbearing for  proximally 6 weeks. Patient understands all risk and wants surgery and signs consent form and wants this performed as soon as possible. At this time I reviewed case with Dr. Jacqualyn Posey and Amalia Hailey and between the 3 of [place] to a bus we'll do the surgery depending on whose schedule we can free as quickly as possible. I explained that to patient and family and they understand this and are willing to accept this for surgery  Multiple views of right foot indicate that there is a probable nonunion of the third metatarsal right even though there is some indication of partial healing. The pain continues to be extensive and due to the pain patient will have to have this revised. Second metatarsal appears to have healed well despite a broken distal screw from trauma and there is no pain upon palpation of this metatarsal

## 2016-05-10 DIAGNOSIS — G4733 Obstructive sleep apnea (adult) (pediatric): Secondary | ICD-10-CM | POA: Diagnosis not present

## 2016-05-10 DIAGNOSIS — J449 Chronic obstructive pulmonary disease, unspecified: Secondary | ICD-10-CM | POA: Diagnosis not present

## 2016-05-10 DIAGNOSIS — J45909 Unspecified asthma, uncomplicated: Secondary | ICD-10-CM | POA: Diagnosis not present

## 2016-05-10 DIAGNOSIS — J439 Emphysema, unspecified: Secondary | ICD-10-CM | POA: Diagnosis not present

## 2016-05-11 ENCOUNTER — Telehealth: Payer: Self-pay | Admitting: *Deleted

## 2016-05-11 NOTE — Telephone Encounter (Signed)
I'm returning your call regarding surgery.  We are trying to have it done on August 30 at [facility] or at [facility].  Dr. Jacqualyn Posey prefers [facility] however, we may not be able to do it there due to cost.  We will let you know which facility.  If it is done at [facility], you will need to have a physical by your primary care physician and a history and physical form completed.   "Dr. Paulla Dolly said it would probably be best to do it at [facility]. I just had a physical about 3-4 weeks ago."  Physical has to be within 30 day period.  "Well I go back to see him on August 25.  I can get it done then."  Also, Dr. Jacqualyn Posey would like for you to come in to see him prior to surgery so he can go over the consent form.  "I already did that with Dr. Paulla Dolly and Dr. Amalia Hailey."  You haven't seen Dr. Jacqualyn Posey regarding this.  Dr. Amalia Hailey will be assisting Dr. Jacqualyn Posey.  He has to explain what he's going to be doing and get you to sign consent form.  He can see you on Monday if you like.  "I guess go ahead and schedule me for Monday.  I shouldn't be charged for this because my insurance may not cover all of this.  I have Medicare."  I'm not sure of the cost.  I'll transfer you to a scheduler so you can make your appointment.

## 2016-05-14 ENCOUNTER — Ambulatory Visit (INDEPENDENT_AMBULATORY_CARE_PROVIDER_SITE_OTHER): Payer: PPO | Admitting: Podiatry

## 2016-05-14 ENCOUNTER — Encounter: Payer: Self-pay | Admitting: Podiatry

## 2016-05-14 DIAGNOSIS — G4733 Obstructive sleep apnea (adult) (pediatric): Secondary | ICD-10-CM | POA: Diagnosis not present

## 2016-05-14 DIAGNOSIS — J45909 Unspecified asthma, uncomplicated: Secondary | ICD-10-CM | POA: Diagnosis not present

## 2016-05-14 DIAGNOSIS — J449 Chronic obstructive pulmonary disease, unspecified: Secondary | ICD-10-CM | POA: Diagnosis not present

## 2016-05-14 DIAGNOSIS — J439 Emphysema, unspecified: Secondary | ICD-10-CM | POA: Diagnosis not present

## 2016-05-14 DIAGNOSIS — S92302K Fracture of unspecified metatarsal bone(s), left foot, subsequent encounter for fracture with nonunion: Secondary | ICD-10-CM

## 2016-05-16 DIAGNOSIS — J4530 Mild persistent asthma, uncomplicated: Secondary | ICD-10-CM | POA: Diagnosis not present

## 2016-05-16 DIAGNOSIS — J301 Allergic rhinitis due to pollen: Secondary | ICD-10-CM | POA: Diagnosis not present

## 2016-05-16 DIAGNOSIS — G4733 Obstructive sleep apnea (adult) (pediatric): Secondary | ICD-10-CM | POA: Diagnosis not present

## 2016-05-16 DIAGNOSIS — R5383 Other fatigue: Secondary | ICD-10-CM | POA: Diagnosis not present

## 2016-05-17 ENCOUNTER — Telehealth: Payer: Self-pay | Admitting: *Deleted

## 2016-05-17 DIAGNOSIS — F419 Anxiety disorder, unspecified: Secondary | ICD-10-CM | POA: Diagnosis not present

## 2016-05-17 DIAGNOSIS — Z01818 Encounter for other preprocedural examination: Secondary | ICD-10-CM | POA: Diagnosis not present

## 2016-05-17 DIAGNOSIS — M545 Low back pain: Secondary | ICD-10-CM | POA: Diagnosis not present

## 2016-05-17 DIAGNOSIS — Z79899 Other long term (current) drug therapy: Secondary | ICD-10-CM | POA: Diagnosis not present

## 2016-05-17 NOTE — Telephone Encounter (Signed)
Stop ASA 7 days before surgery. I will give him percocet postop likely.

## 2016-05-17 NOTE — Telephone Encounter (Signed)
"  I'm calling to let you know that Dr. Jacqualyn Posey said he wants you to stop Aspirin 7 days before surgery.  He said he'll prescribe you Percocet to be used after surgery.  "I stopped taking that Aspirin already because it was making me bruise easy and my blood pressure has been good.  My surgery is scheduled for September 13?"  That is correct.

## 2016-05-17 NOTE — Telephone Encounter (Signed)
I'm calling to inform you Dr. Milinda Pointer wants to do your surgery on September 13 instead of September 1.  Is that date okay with you?  "I have to have a cardiac injection on Wednesdays, every two weeks.  I can't miss them.  I just had one yesterday.  So how does that date fall on my schedule?  I'm at a restaurant and can't check it right now."  That is going to be a free week. You should be okay.  "Okay, it works for me.  I saw my doctor today.  He should have sent that completed form and an updated medication list.  There were several that I don't take anymore on there.  I want him to know that he may need to prescribe something stronger than Hydrocodone.  I take about 6 of those a day for my heart and foot problems.  I assume he wants me to stop my baby aspirin prior to the surgery."  I will inform Dr. Jacqualyn Posey of everything.

## 2016-05-20 NOTE — Progress Notes (Signed)
Subjective:  60 year old male presents the office today for surgical consultation. He previously underwent right second third metatarsal open reduction internal fixation due to nonunion calcaneal bone graft harvest with Dr. Paulla Dolly. He said swelling has continued to have pain on the third metatarsal base. He's had multiple conversations with Dr. Paulla Dolly in regards to further surgical intervention to the nonunion of the third metatarsal and due to continued pain. He presents a discussed surgical intervention although he has our to been scheduled. Denies any systemic complaints such as fevers, chills, nausea, vomiting. No acute changes since last appointment, and no other complaints at this time.   Objective: AAO x3, NAD DP/PT pulses palpable bilaterally, CRT less than 3 seconds Scar from the prior surgeries well healed. There is continued tenderness palpation most of the third metatarsal base. There is some discomfort of the second metatarsal but not as significant. There is minimal overlying edema or any erythema or increase in warmth. He does of this area does continue to swell throughout the day. No other areas of tenderness identified this time. No edema, erythema, increase in warmth to bilateral lower extremities.  No open lesions or pre-ulcerative lesions.  No pain with calf compression, swelling, warmth, erythema  Assessment: Nonunion third metatarsal base  Plan: -All treatment options discussed with the patient including all alternatives, risks, complications.  -At this time a discussed with him surgical intervention. Also discussed with him conservative treatment. Due to the continued pain within revisional surgery is warranted. I discussed with him removal of the hardware as well as bone graft harvest application of internal fixation of the third metatarsal. Also removal of the hardware to the second metatarsal of the areas not healed new hardware will be placed. He wishes to proceed with  surgery. Discussed with him this is not a guarantee that he could continue to have pain are worsening of symptoms from surgery. He understands.  The incision placement as well as the postoperative course was discussed with the patient. I discussed risks of the surgery which include, but not limited to, infection, bleeding, pain, swelling, need for further surgery, delayed or nonhealing, painful or ugly scar, numbness or sensation changes, over/under correction, recurrence, transfer lesions, further deformity, hardware failure, DVT/PE, loss of toe/foot. Patient understands these risks and wishes to proceed with surgery. The surgical consent was reviewed with the patient all 3 pages were signed. No promises or guarantees were given to the outcome of the procedure. All questions were answered to the best of my ability. Before the surgery the patient was encouraged to call the office if there is any further questions. The surgery will be performed at [facility] on an outpatient basis. -Blood work ordered today  Celesta Gentile, DPM

## 2016-05-25 DIAGNOSIS — S92309A Fracture of unspecified metatarsal bone(s), unspecified foot, initial encounter for closed fracture: Secondary | ICD-10-CM

## 2016-05-25 HISTORY — DX: Fracture of unspecified metatarsal bone(s), unspecified foot, initial encounter for closed fracture: S92.309A

## 2016-05-30 DIAGNOSIS — J309 Allergic rhinitis, unspecified: Secondary | ICD-10-CM | POA: Diagnosis not present

## 2016-05-31 ENCOUNTER — Encounter (HOSPITAL_BASED_OUTPATIENT_CLINIC_OR_DEPARTMENT_OTHER): Payer: Self-pay | Admitting: *Deleted

## 2016-05-31 NOTE — Pre-Procedure Instructions (Signed)
EKG req. from [facility]

## 2016-05-31 NOTE — Pre-Procedure Instructions (Signed)
Dr. Cheral Bay notified of K+ of 2.9 on 05/17/2016; will recheck AM DOS.  OK to come for surgery.

## 2016-06-01 ENCOUNTER — Telehealth: Payer: Self-pay | Admitting: *Deleted

## 2016-06-04 ENCOUNTER — Encounter: Payer: Self-pay | Admitting: Podiatry

## 2016-06-05 ENCOUNTER — Encounter (HOSPITAL_BASED_OUTPATIENT_CLINIC_OR_DEPARTMENT_OTHER): Payer: Self-pay | Admitting: *Deleted

## 2016-06-06 ENCOUNTER — Encounter: Payer: Self-pay | Admitting: Podiatry

## 2016-06-06 ENCOUNTER — Encounter (HOSPITAL_BASED_OUTPATIENT_CLINIC_OR_DEPARTMENT_OTHER)
Admission: RE | Disposition: A | Discharge status: Home / Self Care | Payer: Self-pay | Source: Ambulatory Visit | Attending: Podiatry

## 2016-06-06 ENCOUNTER — Ambulatory Visit (HOSPITAL_BASED_OUTPATIENT_CLINIC_OR_DEPARTMENT_OTHER): Payer: PPO | Admitting: Certified Registered"

## 2016-06-06 ENCOUNTER — Encounter (HOSPITAL_BASED_OUTPATIENT_CLINIC_OR_DEPARTMENT_OTHER): Payer: Self-pay | Admitting: Certified Registered"

## 2016-06-06 ENCOUNTER — Ambulatory Visit (HOSPITAL_BASED_OUTPATIENT_CLINIC_OR_DEPARTMENT_OTHER)
Admission: RE | Admit: 2016-06-06 | Discharge: 2016-06-06 | Disposition: A | Discharge status: Home / Self Care | Payer: PPO | Source: Ambulatory Visit | Attending: Podiatry | Admitting: Podiatry

## 2016-06-06 DIAGNOSIS — E785 Hyperlipidemia, unspecified: Secondary | ICD-10-CM | POA: Diagnosis not present

## 2016-06-06 DIAGNOSIS — M722 Plantar fascial fibromatosis: Secondary | ICD-10-CM | POA: Diagnosis not present

## 2016-06-06 DIAGNOSIS — S92334A Nondisplaced fracture of third metatarsal bone, right foot, initial encounter for closed fracture: Secondary | ICD-10-CM | POA: Diagnosis not present

## 2016-06-06 DIAGNOSIS — X58XXXA Exposure to other specified factors, initial encounter: Secondary | ICD-10-CM | POA: Diagnosis not present

## 2016-06-06 DIAGNOSIS — Z6838 Body mass index (BMI) 38.0-38.9, adult: Secondary | ICD-10-CM | POA: Diagnosis not present

## 2016-06-06 DIAGNOSIS — S92301A Fracture of unspecified metatarsal bone(s), right foot, initial encounter for closed fracture: Secondary | ICD-10-CM | POA: Diagnosis not present

## 2016-06-06 DIAGNOSIS — G8918 Other acute postprocedural pain: Secondary | ICD-10-CM | POA: Diagnosis not present

## 2016-06-06 DIAGNOSIS — M79671 Pain in right foot: Secondary | ICD-10-CM | POA: Diagnosis not present

## 2016-06-06 DIAGNOSIS — J449 Chronic obstructive pulmonary disease, unspecified: Secondary | ICD-10-CM | POA: Diagnosis not present

## 2016-06-06 DIAGNOSIS — J45909 Unspecified asthma, uncomplicated: Secondary | ICD-10-CM | POA: Insufficient documentation

## 2016-06-06 DIAGNOSIS — F419 Anxiety disorder, unspecified: Secondary | ICD-10-CM | POA: Insufficient documentation

## 2016-06-06 DIAGNOSIS — F17200 Nicotine dependence, unspecified, uncomplicated: Secondary | ICD-10-CM | POA: Diagnosis not present

## 2016-06-06 DIAGNOSIS — Z8546 Personal history of malignant neoplasm of prostate: Secondary | ICD-10-CM | POA: Diagnosis not present

## 2016-06-06 DIAGNOSIS — E119 Type 2 diabetes mellitus without complications: Secondary | ICD-10-CM | POA: Diagnosis not present

## 2016-06-06 DIAGNOSIS — G4733 Obstructive sleep apnea (adult) (pediatric): Secondary | ICD-10-CM | POA: Insufficient documentation

## 2016-06-06 DIAGNOSIS — S92331A Displaced fracture of third metatarsal bone, right foot, initial encounter for closed fracture: Secondary | ICD-10-CM | POA: Insufficient documentation

## 2016-06-06 DIAGNOSIS — I10 Essential (primary) hypertension: Secondary | ICD-10-CM | POA: Insufficient documentation

## 2016-06-06 DIAGNOSIS — S92324A Nondisplaced fracture of second metatarsal bone, right foot, initial encounter for closed fracture: Secondary | ICD-10-CM | POA: Diagnosis not present

## 2016-06-06 HISTORY — PX: STERIOD INJECTION: SHX5046

## 2016-06-06 HISTORY — DX: Other intervertebral disc degeneration, lumbar region without mention of lumbar back pain or lower extremity pain: M51.369

## 2016-06-06 HISTORY — DX: Other cervical disc degeneration, unspecified cervical region: M50.30

## 2016-06-06 HISTORY — DX: Type 2 diabetes mellitus without complications: E11.9

## 2016-06-06 HISTORY — DX: Other intervertebral disc degeneration, lumbar region: M51.36

## 2016-06-06 HISTORY — DX: Fracture of unspecified metatarsal bone(s), unspecified foot, initial encounter for closed fracture: S92.309A

## 2016-06-06 HISTORY — PX: ORIF TOE FRACTURE: SHX5032

## 2016-06-06 HISTORY — DX: Unspecified cataract: H26.9

## 2016-06-06 HISTORY — DX: Malignant neoplasm of prostate: C61

## 2016-06-06 HISTORY — DX: Benign prostatic hyperplasia without lower urinary tract symptoms: N40.0

## 2016-06-06 HISTORY — DX: Pure hypercholesterolemia, unspecified: E78.00

## 2016-06-06 HISTORY — DX: Deviated nasal septum: J34.2

## 2016-06-06 HISTORY — PX: HARDWARE REMOVAL: SHX979

## 2016-06-06 LAB — GLUCOSE, CAPILLARY: GLUCOSE-CAPILLARY: 135 mg/dL — AB (ref 65–99)

## 2016-06-06 LAB — POCT I-STAT, CHEM 8
BUN: 15 mg/dL (ref 6–20)
CALCIUM ION: 1.16 mmol/L (ref 1.15–1.40)
CHLORIDE: 97 mmol/L — AB (ref 101–111)
Creatinine, Ser: 0.8 mg/dL (ref 0.61–1.24)
Glucose, Bld: 112 mg/dL — ABNORMAL HIGH (ref 65–99)
HEMATOCRIT: 42 % (ref 39.0–52.0)
HEMOGLOBIN: 14.3 g/dL (ref 13.0–17.0)
Potassium: 2.7 mmol/L — CL (ref 3.5–5.1)
SODIUM: 142 mmol/L (ref 135–145)
TCO2: 34 mmol/L (ref 0–100)

## 2016-06-06 SURGERY — REMOVAL, HARDWARE
Anesthesia: Regional | Site: Foot | Laterality: Right

## 2016-06-06 MED ORDER — DEXAMETHASONE SODIUM PHOSPHATE 10 MG/ML IJ SOLN
INTRAMUSCULAR | Status: DC | PRN
  Administered 2016-06-06: 4 mg via INTRAVENOUS

## 2016-06-06 MED ORDER — FENTANYL CITRATE (PF) 100 MCG/2ML IJ SOLN
50.0000 ug | INTRAMUSCULAR | Status: DC | PRN
  Administered 2016-06-06: 50 ug via INTRAVENOUS
  Administered 2016-06-06: 100 ug via INTRAVENOUS

## 2016-06-06 MED ORDER — CEPHALEXIN 500 MG PO CAPS: 500.0000 mg | ORAL_CAPSULE | Freq: Three times a day (TID) | ORAL | 0 refills | Status: DC

## 2016-06-06 MED ORDER — PROMETHAZINE HCL 25 MG/ML IJ SOLN: 6.2500 mg | INTRAMUSCULAR | Status: DC | PRN

## 2016-06-06 MED ORDER — CEFAZOLIN SODIUM-DEXTROSE 2-4 GM/100ML-% IV SOLN
INTRAVENOUS | Status: AC
  Filled 2016-06-06: qty 100

## 2016-06-06 MED ORDER — BUPIVACAINE HCL (PF) 0.5 % IJ SOLN
INTRAMUSCULAR | Status: DC | PRN
  Administered 2016-06-06: 1 mL

## 2016-06-06 MED ORDER — LIDOCAINE 2% (20 MG/ML) 5 ML SYRINGE
INTRAMUSCULAR | Status: DC | PRN
  Administered 2016-06-06: 80 mg via INTRAVENOUS

## 2016-06-06 MED ORDER — OXYCODONE HCL 5 MG/5ML PO SOLN: 5.0000 mg | Freq: Once | ORAL | Status: AC | PRN

## 2016-06-06 MED ORDER — FENTANYL CITRATE (PF) 100 MCG/2ML IJ SOLN
INTRAMUSCULAR | Status: AC
  Filled 2016-06-06: qty 2

## 2016-06-06 MED ORDER — BUPIVACAINE HCL (PF) 0.5 % IJ SOLN
INTRAMUSCULAR | Status: DC | PRN
  Administered 2016-06-06: 30 mL via PERINEURAL

## 2016-06-06 MED ORDER — TRIAMCINOLONE ACETONIDE 40 MG/ML IJ SUSP
INTRAMUSCULAR | Status: DC | PRN
  Administered 2016-06-06: .5 mL

## 2016-06-06 MED ORDER — OXYCODONE HCL 5 MG PO TABS
5.0000 mg | ORAL_TABLET | Freq: Once | ORAL | Status: AC | PRN
  Administered 2016-06-06: 5 mg via ORAL

## 2016-06-06 MED ORDER — HYDROMORPHONE HCL 1 MG/ML IJ SOLN
INTRAMUSCULAR | Status: AC
  Filled 2016-06-06: qty 1

## 2016-06-06 MED ORDER — CEFAZOLIN SODIUM-DEXTROSE 2-4 GM/100ML-% IV SOLN
2.0000 g | INTRAVENOUS | Status: AC
  Administered 2016-06-06: 2 g via INTRAVENOUS

## 2016-06-06 MED ORDER — LIDOCAINE HCL 2 % IJ SOLN
INTRAMUSCULAR | Status: AC
  Filled 2016-06-06: qty 20

## 2016-06-06 MED ORDER — HYDROMORPHONE HCL 1 MG/ML IJ SOLN
0.2500 mg | INTRAMUSCULAR | Status: DC | PRN
  Administered 2016-06-06 (×3): 0.5 mg via INTRAVENOUS

## 2016-06-06 MED ORDER — TRIAMCINOLONE ACETONIDE 40 MG/ML IJ SUSP
INTRAMUSCULAR | Status: AC
  Filled 2016-06-06: qty 5

## 2016-06-06 MED ORDER — ONDANSETRON HCL 4 MG/2ML IJ SOLN
INTRAMUSCULAR | Status: DC | PRN
  Administered 2016-06-06: 4 mg via INTRAVENOUS

## 2016-06-06 MED ORDER — LIDOCAINE HCL (PF) 1 % IJ SOLN
INTRAMUSCULAR | Status: AC
  Filled 2016-06-06: qty 30

## 2016-06-06 MED ORDER — LACTATED RINGERS IV SOLN
INTRAVENOUS | Status: DC
  Administered 2016-06-06: 10:00:00 via INTRAVENOUS

## 2016-06-06 MED ORDER — CHLORHEXIDINE GLUCONATE CLOTH 2 % EX PADS: 6.0000 | MEDICATED_PAD | Freq: Once | CUTANEOUS | Status: DC

## 2016-06-06 MED ORDER — GLYCOPYRROLATE 0.2 MG/ML IJ SOLN: 0.2000 mg | Freq: Once | INTRAMUSCULAR | Status: DC | PRN

## 2016-06-06 MED ORDER — PROMETHAZINE HCL 25 MG PO TABS: 25.0000 mg | ORAL_TABLET | Freq: Three times a day (TID) | ORAL | 0 refills | Status: DC | PRN

## 2016-06-06 MED ORDER — LACTATED RINGERS IV SOLN
INTRAVENOUS | Status: DC
  Administered 2016-06-06 (×2): via INTRAVENOUS

## 2016-06-06 MED ORDER — BUPIVACAINE HCL (PF) 0.5 % IJ SOLN
INTRAMUSCULAR | Status: AC
  Filled 2016-06-06: qty 30

## 2016-06-06 MED ORDER — KETOROLAC TROMETHAMINE 30 MG/ML IJ SOLN
30.0000 mg | Freq: Once | INTRAMUSCULAR | Status: AC | PRN
  Administered 2016-06-06: 30 mg via INTRAVENOUS

## 2016-06-06 MED ORDER — OXYCODONE HCL 5 MG PO TABS
ORAL_TABLET | ORAL | Status: AC
  Filled 2016-06-06: qty 1

## 2016-06-06 MED ORDER — MIDAZOLAM HCL 2 MG/2ML IJ SOLN
INTRAMUSCULAR | Status: AC
  Filled 2016-06-06: qty 2

## 2016-06-06 MED ORDER — PROPOFOL 10 MG/ML IV BOLUS
INTRAVENOUS | Status: DC | PRN
Start: 2016-06-06 — End: 2016-06-06
  Administered 2016-06-06: 200 mg via INTRAVENOUS

## 2016-06-06 MED ORDER — POTASSIUM CHLORIDE 10 MEQ/100ML IV SOLN
10.0000 meq | Freq: Once | INTRAVENOUS | Status: AC
  Administered 2016-06-06: 10 meq via INTRAVENOUS
  Filled 2016-06-06: qty 100

## 2016-06-06 MED ORDER — MIDAZOLAM HCL 2 MG/2ML IJ SOLN
1.0000 mg | INTRAMUSCULAR | Status: DC | PRN
  Administered 2016-06-06: 2 mg via INTRAVENOUS

## 2016-06-06 MED ORDER — PROPOFOL 10 MG/ML IV BOLUS
INTRAVENOUS | Status: AC
  Filled 2016-06-06: qty 20

## 2016-06-06 MED ORDER — SCOPOLAMINE 1 MG/3DAYS TD PT72: 1.0000 | MEDICATED_PATCH | Freq: Once | TRANSDERMAL | Status: DC | PRN

## 2016-06-06 MED ORDER — KETOROLAC TROMETHAMINE 30 MG/ML IJ SOLN
INTRAMUSCULAR | Status: AC
  Filled 2016-06-06: qty 1

## 2016-06-06 MED ORDER — HYDROMORPHONE HCL 2 MG PO TABS
2.0000 mg | ORAL_TABLET | Freq: Four times a day (QID) | ORAL | 0 refills | Status: DC | PRN
Start: 2016-06-06 — End: 2017-07-20

## 2016-06-06 SURGICAL SUPPLY — 69 items
1.1 MM KWIRE ×3 IMPLANT
1.8 MM DRILL BIT ×3 IMPLANT
BANDAGE ACE 3X5.8 VEL STRL LF (GAUZE/BANDAGES/DRESSINGS) ×4 IMPLANT
BANDAGE ACE 4X5 VEL STRL LF (GAUZE/BANDAGES/DRESSINGS) ×8 IMPLANT
BLADE SURG 15 STRL LF DISP TIS (BLADE) ×4 IMPLANT
BLADE SURG 15 STRL SS (BLADE) ×4
BNDG CONFORM 2 STRL LF (GAUZE/BANDAGES/DRESSINGS) ×4 IMPLANT
BNDG ESMARK 4X9 LF (GAUZE/BANDAGES/DRESSINGS) ×4 IMPLANT
BNDG GAUZE ELAST 4 BULKY (GAUZE/BANDAGES/DRESSINGS) ×4 IMPLANT
COVER BACK TABLE 60X90IN (DRAPES) ×4 IMPLANT
CUFF TOURNIQUET SINGLE 18IN (TOURNIQUET CUFF) IMPLANT
CUFF TOURNIQUET SINGLE 24IN (TOURNIQUET CUFF) ×4 IMPLANT
DRAPE EXTREMITY T 121X128X90 (DRAPE) ×4 IMPLANT
DRAPE IMP U-DRAPE 54X76 (DRAPES) ×4 IMPLANT
DRAPE OEC MINIVIEW 54X84 (DRAPES) ×4 IMPLANT
DRAPE SURG 17X23 STRL (DRAPES) ×4 IMPLANT
DRSG EMULSION OIL 3X3 NADH (GAUZE/BANDAGES/DRESSINGS) ×4 IMPLANT
DURAPREP 26ML APPLICATOR (WOUND CARE) ×4 IMPLANT
ELECT REM PT RETURN 9FT ADLT (ELECTROSURGICAL) ×4
ELECTRODE REM PT RTRN 9FT ADLT (ELECTROSURGICAL) ×2 IMPLANT
GAUZE SPONGE 4X4 12PLY STRL (GAUZE/BANDAGES/DRESSINGS) ×4 IMPLANT
GAUZE SPONGE 4X4 16PLY XRAY LF (GAUZE/BANDAGES/DRESSINGS) IMPLANT
GLOVE BIO SURGEON STRL SZ 6.5 (GLOVE) ×3 IMPLANT
GLOVE BIO SURGEON STRL SZ7.5 (GLOVE) ×8 IMPLANT
GLOVE BIO SURGEON STRL SZ8 (GLOVE) ×4 IMPLANT
GLOVE BIO SURGEONS STRL SZ 6.5 (GLOVE) ×1
GLOVE BIOGEL PI IND STRL 7.0 (GLOVE) ×4 IMPLANT
GLOVE BIOGEL PI IND STRL 8 (GLOVE) ×4 IMPLANT
GLOVE BIOGEL PI INDICATOR 7.0 (GLOVE) ×4
GLOVE BIOGEL PI INDICATOR 8 (GLOVE) ×4
GOWN STRL REUS W/ TWL LRG LVL3 (GOWN DISPOSABLE) ×2 IMPLANT
GOWN STRL REUS W/ TWL XL LVL3 (GOWN DISPOSABLE) ×4 IMPLANT
GOWN STRL REUS W/TWL LRG LVL3 (GOWN DISPOSABLE) ×2
GOWN STRL REUS W/TWL XL LVL3 (GOWN DISPOSABLE) ×4
IMPL DBM REFICIO LINK 2.5CC (Tissue) ×2 IMPLANT
IMPLANT DBM REFICIO LINK 2.5CC (Tissue) ×4 IMPLANT
NDL SAFETY ECLIPSE 18X1.5 (NEEDLE) ×2 IMPLANT
NEEDLE HYPO 18GX1.5 SHARP (NEEDLE) ×2
NEEDLE HYPO 25X1 1.5 SAFETY (NEEDLE) ×8 IMPLANT
NS IRRIG 1000ML POUR BTL (IV SOLUTION) ×4 IMPLANT
PACK BASIN DAY SURGERY FS (CUSTOM PROCEDURE TRAY) ×4 IMPLANT
PADDING CAST ABS 4INX4YD NS (CAST SUPPLIES) ×4
PADDING CAST ABS COTTON 4X4 ST (CAST SUPPLIES) ×4 IMPLANT
PENCIL BUTTON HOLSTER BLD 10FT (ELECTRODE) ×4 IMPLANT
PLATE T-SHAPE 5HOLE 5MM (Plate) ×3 IMPLANT
PLATE TACK ×8 IMPLANT
PLATE TARSALIS SHORT 4HOLE (Plate) ×3 IMPLANT
SCREW LOCK 16X2.7X NS (Screw) ×2 IMPLANT
SCREW LOCK 2.7X14MM (Screw) ×6 IMPLANT
SCREW LOCK 2.7X16MM (Screw) ×1 IMPLANT
SCREW LOCK 2.7X22MM (Screw) ×6 IMPLANT
SCREW LOCK 2.7X24MM (Screw) ×2 IMPLANT
SCREW LOCK 24X2.7X FOREFOOT (Screw) ×4 IMPLANT
SCREW NONLOCK 2.7X16MM (Screw) ×3 IMPLANT
SPLINT FIBERGLASS 4X30 (CAST SUPPLIES) ×4 IMPLANT
SPONGE GAUZE 4X4 12PLY STER LF (GAUZE/BANDAGES/DRESSINGS) ×4 IMPLANT
STAPLER VISISTAT 35W (STAPLE) ×4 IMPLANT
STOCKINETTE 6  STRL (DRAPES) ×2
STOCKINETTE 6 STRL (DRAPES) ×2 IMPLANT
STRIP SUTURE WOUND CLOSURE 1/2 (SUTURE) IMPLANT
SUT MERSILENE 2.0 SH NDLE (SUTURE) ×4 IMPLANT
SUT MNCRL AB 3-0 PS2 18 (SUTURE) ×4 IMPLANT
SUT MNCRL AB 4-0 PS2 18 (SUTURE) ×4 IMPLANT
SUT MON AB 5-0 PS2 18 (SUTURE) IMPLANT
SUT PROLENE 4 0 P 3 18 (SUTURE) IMPLANT
SUT PROLENE 4 0 PS 2 18 (SUTURE) ×8 IMPLANT
SYR BULB 3OZ (MISCELLANEOUS) ×4 IMPLANT
SYRINGE 10CC LL (SYRINGE) ×8 IMPLANT
UNDERPAD 30X30 (UNDERPADS AND DIAPERS) ×4 IMPLANT

## 2016-06-06 NOTE — Anesthesia Procedure Notes (Addendum)
Procedure Name: LMA Insertion Date/Time: 06/06/2016 10:42 AM Performed by: Baxter Flattery Pre-anesthesia Checklist: Patient identified, Emergency Drugs available, Suction available and Patient being monitored Patient Re-evaluated:Patient Re-evaluated prior to inductionOxygen Delivery Method: Circle system utilized Preoxygenation: Pre-oxygenation with 100% oxygen Intubation Type: IV induction Ventilation: Mask ventilation without difficulty LMA: LMA inserted LMA Size: 5.0 Number of attempts: 1 Placement Confirmation: positive ETCO2 and breath sounds checked- equal and bilateral Tube secured with: Tape Dental Injury: Teeth and Oropharynx as per pre-operative assessment

## 2016-06-06 NOTE — Transfer of Care (Signed)
Immediate Anesthesia Transfer of Care Note  Patient: Charles Wells  Procedure(s) Performed: Procedure(s): REMOVAL OF HARDWARE 2ND AND 3RD METATARSALS RIGHT FOOT  (Right) ORIF 2ND AND 3RD METATARSAL, RIGHT FOOT (Right) STEROID INJECTION RIGHT HEEL (Right)  Patient Location: PACU  Anesthesia Type:GA combined with regional for post-op pain  Level of Consciousness: awake, alert , sedated and patient cooperative  Airway & Oxygen Therapy: Patient Spontanous Breathing and Patient connected to face mask oxygen  Post-op Assessment: Report given to RN, Post -op Vital signs reviewed and stable and Patient moving all extremities  Post vital signs: Reviewed and stable  Last Vitals:  Vitals:   06/06/16 1314 06/06/16 1315  BP:  138/73  Pulse: 96 96  Resp: 17 (!) 32  Temp:      Last Pain:  Vitals:   06/06/16 0921  TempSrc: Oral  PainSc: 8       Patients Stated Pain Goal: 4 ([id number])  Complications: No apparent anesthesia complications

## 2016-06-06 NOTE — Consult Note (Signed)
Subjective: 60 year old male presents to the surgical center today for surgical repair of nonunion to the right 2nd and 3rd metatarsals. He previously underwent surgical intervention by Dr. Paulla Dolly for metatarsal fractures when went on to nonunion and broken hardware in the 2nd metatarsal. He has no further questions today. He has ongoing pain to the right foot.  Denies any systemic complaints such as fevers, chills, nausea, vomiting. No acute changes since last appointment, and no other complaints at this time.   Objective: AAO x3, NAD DP/PT pulses palpable bilaterally, CRT less than 3 seconds Scar is well healed from prior surgery. Continued tenderness over the 2nd and 3rd metatarsals over the prior surgical site.  No edema, erythema, increase in warmth to bilateral lower extremities.  No open lesions or pre-ulcerative lesions.  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  -NPO -Again discussed all alternatives, risks, complications of surgery as well as the surgery and post-operative course. He, and his wife who is present today, had no further questions.  -Surgical consent signed. -He is aware than Dr. Amalia Hailey and I will be preforming this surgery.  -Patient encouraged to call the office with any questions, concerns, change in symptoms.   Celesta Gentile, DPM

## 2016-06-06 NOTE — Brief Op Note (Signed)
06/06/2016  1:07 PM  PATIENT:  Charles Wells  60 y.o. male  PRE-OPERATIVE DIAGNOSIS:  post op complication fracture of metatarsal bone   POST-OPERATIVE DIAGNOSIS:  post op complication fracture of metatarsal bone  PROCEDURE:  Procedure(s): REMOVAL OF HARDWARE 2ND AND 3RD METATARSALS RIGHT FOOT  (Right) ORIF 2ND AND 3RD METATARSAL, RIGHT FOOT (Right) STEROID INJECTION RIGHT HEEL (Right)  SURGEON:  Surgeon(s) and Role:    * Trula Slade, DPM - Primary  PHYSICIAN ASSISTANT:   ASSISTANTS: Dr. Daylene Katayama, DPM   ANESTHESIA:   MAC with regional block by anesthesia   EBL:  Total I/O In: 1400 [I.V.:1400] Out: 5 [Blood:5]  BLOOD ADMINISTERED:none  DRAINS: none   LOCAL MEDICATIONS USED:  NONE  SPECIMEN:  No Specimen  DISPOSITION OF SPECIMEN:  N/A  COUNTS:  YES  TOURNIQUET:   Total Tourniquet Time Documented: Calf (Right) - 120 minutes Total: Calf (Right) - 120 minutes   DICTATION: .Dragon Dictation  PLAN OF CARE: Discharge to home after PACU  PATIENT DISPOSITION:  PACU - hemodynamically stable.   Delay start of Pharmacological VTE agent (>24hrs) due to surgical blood loss or risk of bleeding: no

## 2016-06-06 NOTE — Progress Notes (Signed)
Assisted Dr. Rose with right, ultrasound guided, popliteal/saphenous block. Side rails up, monitors on throughout procedure. See vital signs in flow sheet. Tolerated Procedure well.  

## 2016-06-06 NOTE — Anesthesia Postprocedure Evaluation (Signed)
Anesthesia Post Note  Patient: Charles Wells  Procedure(s) Performed: Procedure(s) (LRB): REMOVAL OF HARDWARE 2ND AND 3RD METATARSALS RIGHT FOOT  (Right) ORIF 2ND AND 3RD METATARSAL, RIGHT FOOT (Right) STEROID INJECTION RIGHT HEEL (Right)  Patient location during evaluation: PACU Anesthesia Type: General and Regional Level of consciousness: awake and alert Pain management: pain level controlled Vital Signs Assessment: post-procedure vital signs reviewed and stable Respiratory status: spontaneous breathing, nonlabored ventilation, respiratory function stable and patient connected to nasal cannula oxygen Cardiovascular status: blood pressure returned to baseline and stable Postop Assessment: no signs of nausea or vomiting Anesthetic complications: no    Last Vitals:  Vitals:   06/06/16 1314 06/06/16 1315  BP:  138/73  Pulse: 96 96  Resp: 17 (!) 32  Temp: 36.8 C     Last Pain:  Vitals:   06/06/16 0921  TempSrc: Oral  PainSc: 8                  Nivin Braniff S

## 2016-06-06 NOTE — Anesthesia Procedure Notes (Signed)
Anesthesia Regional Block:  Popliteal block  Pre-Anesthetic Checklist: ,, timeout performed, Correct Patient, Correct Site, Correct Laterality, Correct Procedure, Correct Position, site marked, Risks and benefits discussed,  Surgical consent,  Pre-op evaluation,  At surgeon's request and post-op pain management  Laterality: Right  Prep: chloraprep       Needles:  Injection technique: Single-shot  Needle Type: Echogenic Needle     Needle Length: 9cm 9 cm Needle Gauge: 21 G    Additional Needles:  Procedures: ultrasound guided (picture in chart) Popliteal block Narrative:  Injection made incrementally with aspirations every 5 mL.  Performed by: Personally  Anesthesiologist: Aimy Sweeting  Additional Notes: Patient tolerated the procedure well without complications

## 2016-06-06 NOTE — Discharge Instructions (Signed)
After Surgery Instructions   1) If you are recuperating from surgery anywhere other than home, please be sure to leave [place] the number where you can be reached.  2) Go directly home and rest.  3) Keep the operated foot(feet) elevated six inches above the hip when sitting or lying down. This will help control swelling and pain.  4) Support the elevated foot and leg with pillows. DO NOT PLACE PILLOWS UNDER THE KNEE.  5) DO NOT REMOVE or get your bandages WET, unless you were given different instructions by your doctor to do so. This increases the risk of infection.    Post Anesthesia Home Care Instructions  Activity: Get plenty of rest for the remainder of the day. A responsible adult should stay with you for 24 hours following the procedure.  For the next 24 hours, DO NOT: -Drive a car -Paediatric nurse -Drink alcoholic beverages -Take any medication unless instructed by your physician -Make any legal decisions or sign important papers.  Meals: Start with liquid foods such as gelatin or soup. Progress to regular foods as tolerated. Avoid greasy, spicy, heavy foods. If nausea and/or vomiting occur, drink only clear liquids until the nausea and/or vomiting subsides. Call your physician if vomiting continues.  Special Instructions/Symptoms: Your throat may feel dry or sore from the anesthesia or the breathing tube placed in your throat during surgery. If this causes discomfort, gargle with warm salt water. The discomfort should disappear within 24 hours.  If you had a scopolamine patch placed behind your ear for the management of post- operative nausea and/or vomiting:  1. The medication in the patch is effective for 72 hours, after which it should be removed.  Wrap patch in a tissue and discard in the trash. Wash hands thoroughly with soap and water. 2. You may remove the patch earlier than 72 hours if you experience unpleasant side effects which may include dry mouth, dizziness or  visual disturbances. 3. Avoid touching the patch. Wash your hands with soap and water after contact with the patch.      Regional Anesthesia Blocks  1. Numbness or the inability to move the "blocked" extremity may last from 3-48 hours after placement. The length of time depends on the medication injected and your individual response to the medication. If the numbness is not going away after 48 hours, call your surgeon.  2. The extremity that is blocked will need to be protected until the numbness is gone and the  Strength has returned. Because you cannot feel it, you will need to take extra care to avoid injury. Because it may be weak, you may have difficulty moving it or using it. You may not know what position it is in without looking at it while the block is in effect.  3. For blocks in the legs and feet, returning to weight bearing and walking needs to be done carefully. You will need to wait until the numbness is entirely gone and the strength has returned. You should be able to move your leg and foot normally before you try and bear weight or walk. You will need someone to be with you when you first try to ensure you do not fall and possibly risk injury.  4. Bruising and tenderness at the needle site are common side effects and will resolve in a few days.  5. Persistent numbness or new problems with movement should be communicated to the surgeon or the [place] [phone] [place] (  [phone]).  6) Wear your surgical shoe or surgical boot at all times when you are up on your feet.  7) A limited amount of pain and swelling may occur. The skin may take on a bruised appearance. DO NOT BE ALARMED, THIS IS NORMAL.  8) For slight pain and swelling, apply an ice pack directly over the bandages for 15 minutes only out of each hour of the day. Continue until seen in the office for your first post op visit. DON NOT     APPLY ANY FORM OF HEAT TO THE AREA.  9)  Have prescriptions filled immediately and take as directed.  10) Drink lots of liquids, water and juice to stay hydrated.  11) CALL IMMEDIATELY IF:  *Bleeding continues until the following day of surgery  *Pain increases and/or does not respond to medication  *Bandages or cast appears to tight  *If your bandage gets wet  *Trip, fall or stump your surgical foot  *If your temperature goes above 101  *If you have ANY questions at all  12) RESUME ALL HOME MEDICATIONS AS BEFORE EXCEPT FOR VICODIN- YOU CAN TAKE DILAUDID 2MG  AS PRESCRIBED  13) DO NOT PUT WEIGHT ON YOUR SURGICAL FOOT.   YOU NOW CONTROL THE EFFORT OF YOUR RECOVERY. ADHERING TO THESE INSTRUCTIONS WILL OFFER YOU THE MOST COMPLETE RESULTS

## 2016-06-06 NOTE — Anesthesia Preprocedure Evaluation (Signed)
Anesthesia Evaluation  Patient identified by MRN, date of birth, ID band Patient awake    Reviewed: Allergy & Precautions, NPO status , Patient's Chart, lab work & pertinent test results  Airway Mallampati: II  TM Distance: >3 FB Neck ROM: Full    Dental no notable dental hx.    Pulmonary COPD, Current Smoker,    Pulmonary exam normal breath sounds clear to auscultation       Cardiovascular hypertension, Normal cardiovascular exam Rhythm:Regular Rate:Normal     Neuro/Psych negative neurological ROS  negative psych ROS   GI/Hepatic negative GI ROS, Neg liver ROS,   Endo/Other  diabetesMorbid obesity  Renal/GU negative Renal ROS  negative genitourinary   Musculoskeletal negative musculoskeletal ROS (+)   Abdominal   Peds negative pediatric ROS (+)  Hematology negative hematology ROS (+)   Anesthesia Other Findings   Reproductive/Obstetrics negative OB ROS                             Anesthesia Physical Anesthesia Plan  ASA: III  Anesthesia Plan: General   Post-op Pain Management: GA combined w/ Regional for post-op pain   Induction: Intravenous  Airway Management Planned: LMA  Additional Equipment:   Intra-op Plan:   Post-operative Plan: Extubation in OR  Informed Consent: I have reviewed the patients History and Physical, chart, labs and discussed the procedure including the risks, benefits and alternatives for the proposed anesthesia with the patient or authorized representative who has indicated his/her understanding and acceptance.   Dental advisory given  Plan Discussed with: CRNA and Surgeon  Anesthesia Plan Comments:         Anesthesia Quick Evaluation

## 2016-06-07 NOTE — Telephone Encounter (Signed)
"  Patient's surgery requires authorization."  I will take care of it.

## 2016-06-08 ENCOUNTER — Encounter (HOSPITAL_BASED_OUTPATIENT_CLINIC_OR_DEPARTMENT_OTHER): Payer: Self-pay | Admitting: Podiatry

## 2016-06-08 NOTE — Op Note (Signed)
Surgeon: Charles Wells, DPM Assistants: Daylene Katayama, DPM Pre-operative diagnosis: Right foot 2nd and 3rd metatarsal fracture nonunion Post-operative diagnosis: same Procedure: Right foot 2nd and 3rd metatarsal ORIF revision with calcaneal bone graft  Pathology: Specimen: none Pertinent Intra-op findings: see below Anesthesia: Local block by anesthesia and MAC Hemostasis: PCT @ 259mmHg EBL: minimal Materials: Integra plates/screws, 2-0, 3-0 monocryl, 4-0 prolene, skin staples Injectables: none Complications: none  Indications for surgery: Charles Wells presented to the office initially with concerns of pain on the right foot. He has would have nonunion from prior injury. He then underwent surgery with Dr. Paulla Dolly for nonunion of second third metatarsal fractures and he underwent ORIF with calcaneal bone graft. He says we went on to have nonhealing again and the hardware was loose and didn't break. Because this Dr. Paulla Dolly discussed with him further surgical intervention for revision of the metatarsal fracture open reduction internal fixation. He presented to the office for me for surgical consultation. Discussed with him the surgery as well as the postoperative course as well as potential complications and due to the pain he wishes to proceed with surgery. All alternatives, risks, complications were discussed with the patient detail. No promises or guarantees were given as to the outcome of the procedure and all questions were answered to best of my ability.   Procedure in detail: The patient was both verbally and visually identified by myself, the nursing staff, and anesthesia staff in the preoperative holding area. They were then transferred to the operating room and placed on the operative table in supine position. Once an adequate plane of anesthesia well-padded pneumatic calf tourniquet was placed on the right leg. The right lower chemise and scrubbed prepped and draped in normal sterile fashion.  Timeout was performed.  Attention was directed overlying the dorsal aspect of the midfoot on the area of the scar from the prior surgery. Incision was made with #15 with scalpel the epidermis the dermis the 16th tissues were then bluntly sharp dissection making sure to retract all vital neurovascular structures and all bleeders were cauterized as necessary. Dissection was then carried out in a layered fashion in which a deep incision is made adjacent to the extensor tendons and carried down making sure to maintain hemostasis. At this time the plate and screws for the second metatarsal were identified and the screws were found to be loose and the distal screws in the metatarsal was broken. The plate's and the screws were removed. The broken portion screw was able to find therefore it was left in place. At this time evaluation of the metatarsal did reveal nonunion continuing of the second metatarsal. This was debrided with a rongeur as well as a curet down to healthy bone.Marland Kitchen Next attention was then directed on the third metatarsal which is secondary deep incision was made on the adjacent aspect of the extensor tendon and the dissection was carried down where fashion. Next the third metatarsal plate and screws were identified and again the screws were found to be loose. The plate and screws were removed in total. Evaluation of the third metatarsal again did reveal nonunion. The nonunion site was debrided with a rongeur as well as a curet down to healthy bone. Incision is covered C ago sterile saline and hemostasis was achieved. The fracture ends were then drilled with a 1.1 K wire to help facilitate healing and bleeding. Next at this time opted to proceed with calcaneal bone graft harvest. A small incision is benign lateral aspirate of the  heel. A#1t blade scalpel was used to make a small incision on the lateral heel through the epidermis and dermis and using utilizing a hemostat blunt dissection was carried down to  the calcaneus. Next using the Integra graft harvester bone was removed from the calcaneus was passed off to the back table.  At this time digit was redirected along the fracture sites. Integra T plate was chosen for the second metatarsal was temporally fixated to the second metatarsal and fluoroscopy visualized to confirm placement. Once the appropriate position Integra screws were then placed under standard technique data compression. Fluoroscopy was utilized to identify the hardware is in the appropriate position. A straight plate was then chosen and placed on the third metatarsal overlying the nonunion. Again to break fixation was utilized to hold the hardware in place and for [place] to be utilized to confirm placement before the 2.7 mm screws were inserted under standard technique. Again fluoroscopy also confirmed the hardware in the appropriate position.  Next Integra cortical fibers were mixed with the patient's blood was drawn by anesthesia in the bone harvested from the calcaneus was also mixed within this. This was placed along both of the fracture sites. Incision was irrigated and final x-rays were performed. Hardware taper revision there is on the adequate stability of the fracture sites. Incision was then closed in layer fashion so the deep structures with 2-0 Monocryl subcuticular 3-0 Monocryl and skin was then closed with 4-0 Prolene implant as well as skin staples. Betadine was over the incision followed by Xeroform and a dry sterile dressing. The tourniquet was then released lithotomy and immediate capillary refill to all the digits. A well-padded below-knee fiberglass posterior splint was applied making sure to pad all bony prominences.  At the conclusion of the procedure the patient was awoken from anesthesia and found to have tolerated the procedure well any complications. There were transferred to PACU with vital signs stable and vascular status intact.  Charles Wells, DPM

## 2016-06-08 NOTE — H&P (Signed)
Completed in paper chart

## 2016-06-11 ENCOUNTER — Ambulatory Visit (INDEPENDENT_AMBULATORY_CARE_PROVIDER_SITE_OTHER): Payer: PPO | Admitting: Podiatry

## 2016-06-11 ENCOUNTER — Ambulatory Visit (INDEPENDENT_AMBULATORY_CARE_PROVIDER_SITE_OTHER): Payer: PPO

## 2016-06-11 DIAGNOSIS — S92302K Fracture of unspecified metatarsal bone(s), left foot, subsequent encounter for fracture with nonunion: Secondary | ICD-10-CM

## 2016-06-11 DIAGNOSIS — S92301K Fracture of unspecified metatarsal bone(s), right foot, subsequent encounter for fracture with nonunion: Secondary | ICD-10-CM

## 2016-06-11 DIAGNOSIS — Z09 Encounter for follow-up examination after completed treatment for conditions other than malignant neoplasm: Secondary | ICD-10-CM

## 2016-06-11 NOTE — Progress Notes (Signed)
DOS 09.13.2017 Removal of plate 2,3 metatarsal right; Repair fracture 3rd metatarsal right with plate and graft; Heel Injection right

## 2016-06-11 NOTE — Patient Instructions (Signed)
Venous Thromboembolism Prevention Venous thromboembolism (VTE) is a condition in which a blood clot (thrombus) develops in the body. A thrombus usually occurs in a deep vein in the leg or the pelvis (DVT), but it can also occur in the arm. Sometimes, pieces of a thrombus can break off from its original place of development and travel through the bloodstream to other parts of the body. When that happens, the thrombus is called an embolus. An embolus that travels to one or both lungs is called a pulmonary embolism. An embolism can block the blood flow in the blood vessels of other organs as well. VTE is a serious health condition that can cause disability or death. It is very important to get help right away and to not ignore symptoms. HOW CAN A VTE BE PREVENTED?  Exercise regularly. Take a brisk 30 minute walk every day. Staying active and moving around can help you to prevent blood clots.  Avoid sitting or lying in bed for long periods of time without moving your legs. Change your position often, especially during long-distance travel (over 4 hours).  If you are a woman who is over 53 years of age, avoid unnecessary use of medicines that contain estrogen. These include birth control pills and hormone replacement therapy.  Do not smoke, especially if you take estrogen medicines. If you need help quitting, ask your health care provider.  Eat plenty of fruits and vegetables. Ask your health care provider or dietitian if there are foods that you should avoid.  Maintain a weight that is appropriate for your height. Ask your health care provider what weight is healthy for you.  Wear loose-fitting clothing. Avoid constrictive or tight clothing around your legs or waist.  Try not to bump or injure your legs. Avoid crossing your legs when you are sitting.  Do not use pillows under your knees while lying down unless told by your health care provider.  Wear support hose (compression stockings or TED  hose) as told by your health care provider Compression stockings increase blood flow in your legs and can help prevent blood clots. Do not let them bunch up when you are wearing them. HOW CAN I PREVENT VTE WHEN I TRAVEL? Long-distance travel (over 4 hours) can increase the risk of a VTE. To prevent VTE when traveling:  Exercise your legs every hour by standing, stretching, and bending and straightening your legs. If you are traveling by airplane, train, or bus, walk up and down the aisle as often as possible to get your blood moving. If you are traveling by car, stop and get out of the car every hour to exercise your legs and stretch. Other types of exercise might include:  Keeping your feet flat on the ground and raising your toes.  Switching from tightening the muscles in your calves and thighs to relaxing those same muscles while you are sitting.  Pointing and flexing your feet at the ankle joints while you are sitting.  Stay well hydrated while traveling. Drink enough water to keep your urine clear or pale yellow.  Avoid drinking alcohol during long travel. Generally, it is not recommended that you take medicines to prevent DVT during routine travel. HOW CAN VTE BE PREVENTED IF I AM HOSPITALIZED? A VTE may be prevented by taking medicines that are prescribed to prevent blood clots (anticoagulants). You can also help to prevent VTE while in the hospital by taking these actions:  Get out of bed and walk. Ask your health care provider  if this is safe for you to do.  Request the use of a sequential compression device (SCD). This is a machine that pumps air into compression sleeves that are wrapped around your legs.  Request the use of compression stockings, which are tight, elastic stockings that apply pressure to the lower legs. Compression stockings are sometimes used with SCDs. HOW CAN I PREVENT VTE AFTER SURGERY? Understand that there is an increased risk for VTE for the first 4-6 weeks  after surgery. During this time:  Avoid long-distance travel (over 4 hours). If you must travel during this time, ask your health care provider about additional preventive actions that you can take. These might include exercising your arms and legs every hour while you travel.  Avoid sitting or lying still for too long. If possible, get up and walk around one time every hour. Ask your health care provider when this is safe for you to do. SEEK IMMEDIATE MEDICAL CARE IF:  You have new or increased pain, swelling, or redness in an arm or leg.  You have numbness or tingling in an arm or leg.  You have shortness of breath while active or at rest.  You have chest pain.  You have a rapid or irregular heartbeat.  You feel light-headed or dizzy.  You cough up blood.  You notice blood in your vomit, bowel movement, or urine. These symptoms may represent a serious problem that is an emergency. Do not wait to see if the symptoms will go away. Get medical help right away. Call your local emergency services (911 in the [place] Do not drive yourself to the hospital.   This information is not intended to replace advice given to you by your health care provider. Make sure you discuss any questions you have with your health care provider.   Document Released: 08/29/2009 Document Revised: 06/01/2015 Document Reviewed: 01/05/2015 Elsevier Interactive Patient Education [employer].

## 2016-06-13 DIAGNOSIS — J309 Allergic rhinitis, unspecified: Secondary | ICD-10-CM | POA: Diagnosis not present

## 2016-06-14 DIAGNOSIS — G4733 Obstructive sleep apnea (adult) (pediatric): Secondary | ICD-10-CM | POA: Diagnosis not present

## 2016-06-14 DIAGNOSIS — J45909 Unspecified asthma, uncomplicated: Secondary | ICD-10-CM | POA: Diagnosis not present

## 2016-06-14 DIAGNOSIS — J449 Chronic obstructive pulmonary disease, unspecified: Secondary | ICD-10-CM | POA: Diagnosis not present

## 2016-06-14 DIAGNOSIS — J439 Emphysema, unspecified: Secondary | ICD-10-CM | POA: Diagnosis not present

## 2016-06-15 DIAGNOSIS — E876 Hypokalemia: Secondary | ICD-10-CM | POA: Diagnosis not present

## 2016-06-15 DIAGNOSIS — F419 Anxiety disorder, unspecified: Secondary | ICD-10-CM | POA: Diagnosis not present

## 2016-06-15 DIAGNOSIS — M545 Low back pain: Secondary | ICD-10-CM | POA: Diagnosis not present

## 2016-06-18 NOTE — Progress Notes (Signed)
Subjective: Charles Wells is a 60 y.o. is seen today in office s/p right foot metatarsal ORIF with calcaneal bone graft preformed on 06/06/16. He states that he is doing well and he is not taking any pain medication. He has remained NWB as much as possible. He had continued with antibiotics. Denies any systemic complaints such as fevers, chills, nausea, vomiting. No calf pain, chest pain, shortness of breath.   Objective: General: No acute distress, AAOx3  DP/PT pulses palpable 2/4, CRT < 3 sec to all digits.  Protective sensation intact. Motor function intact.  Right foot: Incision is well coapted without any evidence of dehiscence and sutures/staples are intact. There is no surrounding erythema, ascending cellulitis, fluctuance, crepitus, malodor, drainage/purulence. There is mild edema around the surgical site. There is no signficant pain along the surgical site.  No other areas of tenderness to bilateral lower extremities.  No other open lesions or pre-ulcerative lesions.  No pain with calf compression, swelling, warmth, erythema.   Assessment and Plan:  Status post right foot metatarsal ORIF, doing well with no complications   -Treatment options discussed including all alternatives, risks, and complications -X-rays were obtained and reviewed with the patient. Hardware intact. Radial lucent line is still evident along the area of the fractures. No evidence of new acute fracture. -Antibiotic was applied followed by dry sterile dressing. Keep dressing clean, dry, intact. Posterior splint was reapplied today. -Ice and elevation -Pain medication as needed. He states he is not taking pain medicine over the last few days. -Aspirin daily -Monitor for any clinical signs or symptoms of infection and DVT/PE and directed to call the office immediately should any occur or go to the ER. -Follow-up in 1 week or sooner if any problems arise. In the meantime, encouraged to call the office with any  questions, concerns, change in symptoms.   Celesta Gentile, DPM

## 2016-06-22 ENCOUNTER — Telehealth: Payer: Self-pay | Admitting: *Deleted

## 2016-06-22 ENCOUNTER — Ambulatory Visit (INDEPENDENT_AMBULATORY_CARE_PROVIDER_SITE_OTHER): Payer: PPO | Admitting: Podiatry

## 2016-06-22 DIAGNOSIS — Z09 Encounter for follow-up examination after completed treatment for conditions other than malignant neoplasm: Secondary | ICD-10-CM

## 2016-06-22 DIAGNOSIS — S92302K Fracture of unspecified metatarsal bone(s), left foot, subsequent encounter for fracture with nonunion: Secondary | ICD-10-CM

## 2016-06-22 DIAGNOSIS — Z9889 Other specified postprocedural states: Secondary | ICD-10-CM

## 2016-06-22 MED ORDER — OXYCODONE-ACETAMINOPHEN 10-325 MG PO TABS: 1.0000 | ORAL_TABLET | Freq: Four times a day (QID) | ORAL | 0 refills | Status: DC | PRN

## 2016-06-22 MED ORDER — OXYCODONE-ACETAMINOPHEN 10-325 MG PO TABS
1.0000 | ORAL_TABLET | Freq: Four times a day (QID) | ORAL | 0 refills | Status: DC | PRN
Start: 2016-06-22 — End: 2016-06-22

## 2016-06-22 NOTE — Progress Notes (Signed)
Subjective: Charles Wells is a 60 y.o. is seen today in office s/p right foot metatarsal ORIF with calcaneal bone graft preformed on 06/06/16. He states that he is doing well. He is not taking pain medication often but is asking for a refill today. He has been taking dilaudid. He has been NWB as much as possible but definitely puts his heel down on the ground as well as his foot at times. He has finished his course of antibiotics. Denies any systemic complaints such as fevers, chills, nausea, vomiting. No calf pain, chest pain, shortness of breath.   Objective: General: No acute distress, AAOx3  DP/PT pulses palpable 2/4, CRT < 3 sec to all digits.  Protective sensation intact. Motor function intact.  Right foot: Posterior splint is intact however there is dirt on the bottom of the splint and appears to be dirty. Incision is well coapted without any evidence of dehiscence and sutures/staples are intact. There is no surrounding erythema, ascending cellulitis, fluctuance, crepitus, malodor, drainage/purulence. There is mild edema around the surgical site. There is no signficant pain along the surgical site.  No other areas of tenderness to bilateral lower extremities.  No other open lesions or pre-ulcerative lesions.  No pain with calf compression, swelling, warmth, erythema.   Assessment and Plan:  Status post right foot metatarsal ORIF, doing well with no complications   -Treatment options discussed including all alternatives, risks, and complications -Sutures and staples removed today without complications and incision is healing well. A pneumatic was applied followed by dressing. Next couple days ago started showers as long as the incision remains closed not causing any issues. In about ointment and a bandage is applied again today. -He has start use the bone stimulator next week. Because of this unable keep him in a posterior splint therefore he can access the site to use about similar.  Recommended him to do this daily and I showed him where to apply the bone stimulator. He verbalizes understanding and he did not want to have the representative contact him in regards to showing do this again today. -Continue nonweightbearing at all times. -Aspirin daily. Monitor for any signs or symptoms of DVT/PE. -Monitor for signs or symptoms of infection drifted into the ER should any occur call the office. -Ice and elevation. -Follow up in 2 weeks or sooner if needed. Encouraged to call the questions, concerns or any changes symptoms. *X-ray next appointment  Celesta Gentile, DPM

## 2016-06-22 NOTE — Telephone Encounter (Addendum)
Charles Wells states pt receives Hydrocodone with acetaminophen 10/325 from Dr. Lennice Sites #5 a day, and Dr. Jacqualyn Posey wrote for Oxycodone with acetaminophen 10/325 #4 a day, which puts pt over the 4000mg  of acetaminophen a day.  Dr. Jacqualyn Posey states discontinue the Hydrocodone with acetaminophen. I informed Charles Wells and she states the pt is not happy because he wants both prescriptions.

## 2016-06-27 DIAGNOSIS — J309 Allergic rhinitis, unspecified: Secondary | ICD-10-CM | POA: Diagnosis not present

## 2016-07-04 DIAGNOSIS — N5201 Erectile dysfunction due to arterial insufficiency: Secondary | ICD-10-CM | POA: Diagnosis not present

## 2016-07-04 DIAGNOSIS — R972 Elevated prostate specific antigen [PSA]: Secondary | ICD-10-CM | POA: Diagnosis not present

## 2016-07-04 DIAGNOSIS — R350 Frequency of micturition: Secondary | ICD-10-CM | POA: Diagnosis not present

## 2016-07-04 DIAGNOSIS — C61 Malignant neoplasm of prostate: Secondary | ICD-10-CM | POA: Diagnosis not present

## 2016-07-09 ENCOUNTER — Encounter: Payer: PPO | Admitting: Podiatry

## 2016-07-13 ENCOUNTER — Ambulatory Visit (INDEPENDENT_AMBULATORY_CARE_PROVIDER_SITE_OTHER): Payer: PPO

## 2016-07-13 ENCOUNTER — Ambulatory Visit (INDEPENDENT_AMBULATORY_CARE_PROVIDER_SITE_OTHER): Payer: PPO | Admitting: Podiatry

## 2016-07-13 ENCOUNTER — Encounter: Payer: Self-pay | Admitting: Podiatry

## 2016-07-13 DIAGNOSIS — Z9889 Other specified postprocedural states: Secondary | ICD-10-CM

## 2016-07-13 DIAGNOSIS — M722 Plantar fascial fibromatosis: Secondary | ICD-10-CM

## 2016-07-13 DIAGNOSIS — S92302K Fracture of unspecified metatarsal bone(s), left foot, subsequent encounter for fracture with nonunion: Secondary | ICD-10-CM

## 2016-07-13 MED ORDER — OXYCODONE-ACETAMINOPHEN 10-325 MG PO TABS: 1.0000 | ORAL_TABLET | Freq: Four times a day (QID) | ORAL | 0 refills | Status: DC | PRN

## 2016-07-13 NOTE — Progress Notes (Signed)
Patient cancelled

## 2016-07-14 DIAGNOSIS — J45909 Unspecified asthma, uncomplicated: Secondary | ICD-10-CM | POA: Diagnosis not present

## 2016-07-14 DIAGNOSIS — J449 Chronic obstructive pulmonary disease, unspecified: Secondary | ICD-10-CM | POA: Diagnosis not present

## 2016-07-14 DIAGNOSIS — G4733 Obstructive sleep apnea (adult) (pediatric): Secondary | ICD-10-CM | POA: Diagnosis not present

## 2016-07-14 DIAGNOSIS — J439 Emphysema, unspecified: Secondary | ICD-10-CM | POA: Diagnosis not present

## 2016-07-18 DIAGNOSIS — F419 Anxiety disorder, unspecified: Secondary | ICD-10-CM | POA: Diagnosis not present

## 2016-07-18 DIAGNOSIS — Z13820 Encounter for screening for osteoporosis: Secondary | ICD-10-CM | POA: Diagnosis not present

## 2016-07-18 DIAGNOSIS — M545 Low back pain: Secondary | ICD-10-CM | POA: Diagnosis not present

## 2016-07-18 DIAGNOSIS — E119 Type 2 diabetes mellitus without complications: Secondary | ICD-10-CM | POA: Diagnosis not present

## 2016-07-18 DIAGNOSIS — Z79899 Other long term (current) drug therapy: Secondary | ICD-10-CM | POA: Diagnosis not present

## 2016-07-18 DIAGNOSIS — J018 Other acute sinusitis: Secondary | ICD-10-CM | POA: Diagnosis not present

## 2016-07-18 NOTE — Progress Notes (Signed)
Subjective: Charles Wells is a 60 y.o. is seen today in office s/p right foot metatarsal ORIF with calcaneal bone graft preformed on 06/06/16. He states that he is doing "great" and he is having minimal pain and when he does have pain the pain medication takes care of it. He still has some pills left over from the previous prescription but is running low. He states the foot feels "so much better" than it did prior to surgery. He does not wear the splint for period of time however when he leaves the house he will wear it. He has not yet started the bone stimulator. Swelling has improved. Denies any systemic complaints such as fevers, chills, nausea, vomiting. No calf pain, chest pain, shortness of breath.   Objective: General: No acute distress, AAOx3  DP/PT pulses palpable 2/4, CRT < 3 sec to all digits.  Protective sensation intact. Motor function intact.  Right foot: Posterior splint is intact however it is dirty. Incision is well coapted without any evidence of dehiscence and a scar has formed.there is minimal to no tenderness along the surgical site on the right foot. There is trace edema to the area without any significant erythema or increase in warmth. There are no other areas of tenderness at this time. There are no open lesions or pre-ulcerative lesions today. No pain with calf compression, swelling, warmth, erythema.   Assessment and Plan:  Status post right foot metatarsal ORIF, improving  -Treatment options discussed including all alternatives, risks, and complications -X-rays were obtained and reviewed with the patient. hardwareintact. The previous broken screw remains physical.there does hear to be some increased consolidation across the fracture sites. No evidence of acute fracture identified. -At this time he can return to the CAM boot but he most with a cam boot at all times. I'm not putting him back into a cast because I 1 him to use the bone stimulator. I again stressed today to  start the bone stimulator and he states that he knows how to use this and he does not need a refresher how to use it. -Ice and elevation -Nonweightbearing -Monitor for any signs or symptoms of infection call the office in medial shin any occur or go to the ER -Follow-up as scheduled or sooner if needed. Call any questions or concerns in the meantime.  Celesta Gentile, DPM

## 2016-07-25 DIAGNOSIS — J309 Allergic rhinitis, unspecified: Secondary | ICD-10-CM | POA: Diagnosis not present

## 2016-08-03 ENCOUNTER — Encounter: Payer: Self-pay | Admitting: Podiatry

## 2016-08-03 ENCOUNTER — Ambulatory Visit (INDEPENDENT_AMBULATORY_CARE_PROVIDER_SITE_OTHER): Payer: PPO

## 2016-08-03 ENCOUNTER — Ambulatory Visit (INDEPENDENT_AMBULATORY_CARE_PROVIDER_SITE_OTHER): Payer: PPO | Admitting: Podiatry

## 2016-08-03 VITALS — Temp 98.6°F

## 2016-08-03 DIAGNOSIS — Z9889 Other specified postprocedural states: Secondary | ICD-10-CM

## 2016-08-03 DIAGNOSIS — S92302K Fracture of unspecified metatarsal bone(s), left foot, subsequent encounter for fracture with nonunion: Secondary | ICD-10-CM

## 2016-08-03 DIAGNOSIS — M722 Plantar fascial fibromatosis: Secondary | ICD-10-CM

## 2016-08-03 DIAGNOSIS — Z09 Encounter for follow-up examination after completed treatment for conditions other than malignant neoplasm: Secondary | ICD-10-CM

## 2016-08-03 MED ORDER — OXYCODONE-ACETAMINOPHEN 10-325 MG PO TABS: 1.0000 | ORAL_TABLET | Freq: Four times a day (QID) | ORAL | 0 refills | Status: DC | PRN

## 2016-08-06 NOTE — Progress Notes (Signed)
Subjective: Charles Wells is a 60 y.o. is seen today in office s/p right foot metatarsal ORIF with calcaneal bone graft preformed on 06/06/16. He states that he is doing good. He gets some occasional numbness and tingling to his fourth and fifth toes have this is intermittent in nature not continuous. He states that overall he feels a lot better than he did prior to surgery.he states that he has not been putting pressure to his foot although he does not wear the CAM boot around the house. He has continue with the bone stimulator. Denies any systemic complaints such as fevers, chills, nausea, vomiting. No calf pain, chest pain, shortness of breath.   Objective: General: No acute distress, AAOx3  DP/PT pulses palpable 2/4, CRT < 3 sec to all digits.  Protective sensation intact. Motor function intact.  Right foot: CAM boot intact Incision is well coapted without any evidence of dehiscence and a scar has formed. There is no significant tenderness along the surgical site on the right foot. There is trace edema to the area without any significant erythema or increase in warmth. Today he denies any numbness or tingling to his toes. Motor function intact of the digits into the ankle. Gives no other areas of tenderness bilaterally. There are no open lesions or pre-ulcerative lesions today. No pain with calf compression, swelling, warmth, erythema.   Assessment and Plan:  Status post right foot metatarsal ORIF,   -Treatment options discussed including all alternatives, risks, and complications -X-rays were obtained and reviewed with the patient. Hardware intact. There does appear to be some increased consolidation across the fracture sites. I also showed him and his significant other the x-ray seen discussed the results with them. -At this point continue with CAM boot at all times. He can start to transition to partial weightbearing with crutches as tolerated on the right foot. At this any increase in pain is  to remain nonweightbearing.  -Continue bone stimulator.  -Ice/elevation -Monitor for any signs or symptoms of infection call the office in medial shin any occur or go to the ER -Follow-up as scheduled or sooner if needed. Call any questions or concerns in the meantime.  *x-ray next appointment; possible referral to PT (wants to go to [facility])  Celesta Gentile, DPM

## 2016-08-07 DIAGNOSIS — G4733 Obstructive sleep apnea (adult) (pediatric): Secondary | ICD-10-CM | POA: Diagnosis not present

## 2016-08-08 DIAGNOSIS — G4733 Obstructive sleep apnea (adult) (pediatric): Secondary | ICD-10-CM | POA: Diagnosis not present

## 2016-08-08 DIAGNOSIS — J4530 Mild persistent asthma, uncomplicated: Secondary | ICD-10-CM | POA: Diagnosis not present

## 2016-08-08 DIAGNOSIS — J301 Allergic rhinitis due to pollen: Secondary | ICD-10-CM | POA: Diagnosis not present

## 2016-08-08 DIAGNOSIS — J309 Allergic rhinitis, unspecified: Secondary | ICD-10-CM | POA: Diagnosis not present

## 2016-08-08 DIAGNOSIS — R5383 Other fatigue: Secondary | ICD-10-CM | POA: Diagnosis not present

## 2016-08-08 DIAGNOSIS — Z23 Encounter for immunization: Secondary | ICD-10-CM | POA: Diagnosis not present

## 2016-08-14 DIAGNOSIS — J45909 Unspecified asthma, uncomplicated: Secondary | ICD-10-CM | POA: Diagnosis not present

## 2016-08-14 DIAGNOSIS — J439 Emphysema, unspecified: Secondary | ICD-10-CM | POA: Diagnosis not present

## 2016-08-14 DIAGNOSIS — J449 Chronic obstructive pulmonary disease, unspecified: Secondary | ICD-10-CM | POA: Diagnosis not present

## 2016-08-14 DIAGNOSIS — G4733 Obstructive sleep apnea (adult) (pediatric): Secondary | ICD-10-CM | POA: Diagnosis not present

## 2016-08-20 ENCOUNTER — Encounter: Payer: Self-pay | Admitting: Podiatry

## 2016-08-20 ENCOUNTER — Ambulatory Visit (INDEPENDENT_AMBULATORY_CARE_PROVIDER_SITE_OTHER): Payer: PPO

## 2016-08-20 ENCOUNTER — Ambulatory Visit (INDEPENDENT_AMBULATORY_CARE_PROVIDER_SITE_OTHER): Payer: PPO | Admitting: Podiatry

## 2016-08-20 DIAGNOSIS — M722 Plantar fascial fibromatosis: Secondary | ICD-10-CM | POA: Diagnosis not present

## 2016-08-20 DIAGNOSIS — F419 Anxiety disorder, unspecified: Secondary | ICD-10-CM | POA: Diagnosis not present

## 2016-08-20 DIAGNOSIS — Z09 Encounter for follow-up examination after completed treatment for conditions other than malignant neoplasm: Secondary | ICD-10-CM

## 2016-08-20 DIAGNOSIS — M545 Low back pain: Secondary | ICD-10-CM | POA: Diagnosis not present

## 2016-08-20 DIAGNOSIS — S92302K Fracture of unspecified metatarsal bone(s), left foot, subsequent encounter for fracture with nonunion: Secondary | ICD-10-CM

## 2016-08-20 MED ORDER — OXYCODONE-ACETAMINOPHEN 10-325 MG PO TABS: 1.0000 | ORAL_TABLET | Freq: Four times a day (QID) | ORAL | 0 refills | Status: DC | PRN

## 2016-08-22 DIAGNOSIS — J309 Allergic rhinitis, unspecified: Secondary | ICD-10-CM | POA: Diagnosis not present

## 2016-08-23 NOTE — Progress Notes (Signed)
Subjective: Charles Wells is a 60 y.o. is seen today in office s/p right foot metatarsal ORIF with calcaneal bone graft preformed on 06/06/16. He states he is doing well and the swelling has been much improved since the surgery. He still gets some occasional discomfort which she takes pain medicine for his states the pain is substantially improved since the surgery. He has been putting some weight to his foot with crutch but not doing significant weightbearing or walking. He has continue the bone stimulator is well. Denies any systemic complaints such as fevers, chills, nausea, vomiting. No calf pain, chest pain, shortness of breath.   Objective: General: No acute distress, AAOx3  DP/PT pulses palpable 2/4, CRT < 3 sec to all digits.  Protective sensation intact. Motor function intact.  Right foot: CAM boot intact Incision is well coapted without any evidence of dehiscence and a scar has formed. There is minimal tenderness along the surgical site on the right foot. There is trace edema to the area without any significant erythema or increase in warmth.  Motor function intact of the digits into the ankle. No other areas of tenderness bilaterally. There are no open lesions or pre-ulcerative lesions today. No pain with calf compression, swelling, warmth, erythema.   Assessment and Plan:  Status post right foot metatarsal ORIF,   -Treatment options discussed including all alternatives, risks, and complications -X-rays were obtained and reviewed with the patient. Hardware intact. There does appear to be some increased consolidation across the fracture sites. Hardware is intact. I did discuss with him these x-rays findings. -At this time he is started to be partial weightbearing to the right foot. Also recommended physical therapy to start this time. A prescription was provided to the patient today for [facility] at his request of facility -Continue with bone stimulator -Continue ice and  elevation -Although he is going to be partially WB, discussed with him to gradually increase activity based on physical therapy. If he has any pain he is to go back to nonweightbearing. -Follow-up as scheduled or sooner if any problems arise. In the meantime, encouraged to call the office with any questions, concerns, change in symptoms.    Charles Wells, DPM

## 2016-08-29 DIAGNOSIS — M6281 Muscle weakness (generalized): Secondary | ICD-10-CM | POA: Diagnosis not present

## 2016-08-29 DIAGNOSIS — R262 Difficulty in walking, not elsewhere classified: Secondary | ICD-10-CM | POA: Diagnosis not present

## 2016-08-29 DIAGNOSIS — M79671 Pain in right foot: Secondary | ICD-10-CM | POA: Diagnosis not present

## 2016-08-29 DIAGNOSIS — G8918 Other acute postprocedural pain: Secondary | ICD-10-CM | POA: Diagnosis not present

## 2016-09-05 DIAGNOSIS — M6281 Muscle weakness (generalized): Secondary | ICD-10-CM | POA: Diagnosis not present

## 2016-09-05 DIAGNOSIS — M79671 Pain in right foot: Secondary | ICD-10-CM | POA: Diagnosis not present

## 2016-09-05 DIAGNOSIS — D3132 Benign neoplasm of left choroid: Secondary | ICD-10-CM | POA: Diagnosis not present

## 2016-09-05 DIAGNOSIS — G8918 Other acute postprocedural pain: Secondary | ICD-10-CM | POA: Diagnosis not present

## 2016-09-05 DIAGNOSIS — E119 Type 2 diabetes mellitus without complications: Secondary | ICD-10-CM | POA: Diagnosis not present

## 2016-09-05 DIAGNOSIS — J309 Allergic rhinitis, unspecified: Secondary | ICD-10-CM | POA: Diagnosis not present

## 2016-09-05 DIAGNOSIS — H5203 Hypermetropia, bilateral: Secondary | ICD-10-CM | POA: Diagnosis not present

## 2016-09-05 DIAGNOSIS — H2513 Age-related nuclear cataract, bilateral: Secondary | ICD-10-CM | POA: Diagnosis not present

## 2016-09-05 DIAGNOSIS — R262 Difficulty in walking, not elsewhere classified: Secondary | ICD-10-CM | POA: Diagnosis not present

## 2016-09-10 ENCOUNTER — Ambulatory Visit (INDEPENDENT_AMBULATORY_CARE_PROVIDER_SITE_OTHER): Payer: Self-pay | Admitting: Podiatry

## 2016-09-10 ENCOUNTER — Encounter: Payer: Self-pay | Admitting: Podiatry

## 2016-09-10 ENCOUNTER — Ambulatory Visit (INDEPENDENT_AMBULATORY_CARE_PROVIDER_SITE_OTHER): Payer: PPO

## 2016-09-10 VITALS — BP 132/89 | HR 94 | Resp 16

## 2016-09-10 DIAGNOSIS — S92302K Fracture of unspecified metatarsal bone(s), left foot, subsequent encounter for fracture with nonunion: Secondary | ICD-10-CM | POA: Diagnosis not present

## 2016-09-10 DIAGNOSIS — Z09 Encounter for follow-up examination after completed treatment for conditions other than malignant neoplasm: Secondary | ICD-10-CM

## 2016-09-10 MED ORDER — OXYCODONE-ACETAMINOPHEN 5-325 MG PO TABS: 1.0000 | ORAL_TABLET | Freq: Three times a day (TID) | ORAL | 0 refills | Status: DC | PRN

## 2016-09-10 NOTE — Progress Notes (Signed)
Subjective: Charles Wells is a 60 y.o. is seen today in office s/p right foot metatarsal ORIF with calcaneal bone graft preformed on 06/06/16. Since last appointment he has started PT. Unfortunately he has been unable to go to physical therapy on a continual basis due to family members been in the hospital and other issues not related to the surgery. He states he is trying to put some weight to his heel however he does get pain to his heel on the bottom. He does state that he gets some swelling still. Overall he continues to state that it feels better than it did prior to surgery. He has continued with the bone stimulator. Denies any systemic complaints such as fevers, chills, nausea, vomiting. No calf pain, chest pain, shortness of breath.   Objective: General: No acute distress, AAOx3  DP/PT pulses palpable 2/4, CRT < 3 sec to all digits.  Protective sensation intact. Motor function intact.  Right foot: CAM boot intact Incision is well coapted without any evidence of dehiscence and a scar has formed. There is minimal tenderness along the surgical site on the right foot which continues. There is trace edema to the area without any significant erythema or increase in warmth. No skin discoloration.  Motor function intact of the digits into the ankle. No other areas of tenderness bilaterally. There are no open lesions or pre-ulcerative lesions today. No pain with calf compression, swelling, warmth, erythema.   Assessment and Plan:  Status post right foot metatarsal ORIF  -Treatment options discussed including all alternatives, risks, and complications -X-rays were obtained and reviewed with the patient. Hardware intact. There does appear to be some increased consolidation across the fracture sites. Hardware is intact. I did discuss with him these x-rays findings again today.  -Continue to transition to WBAT in CAM boot. He is currently using one crutch and wearing the CAM Boot.  -Continue PT -Continue  with bone stimulator -Continue ice and elevation -Follow-up as scheduled or sooner if any problems arise. In the meantime, encouraged to call the office with any questions, concerns, change in symptoms.    Celesta Gentile, DPM

## 2016-09-12 DIAGNOSIS — G8918 Other acute postprocedural pain: Secondary | ICD-10-CM | POA: Diagnosis not present

## 2016-09-12 DIAGNOSIS — M6281 Muscle weakness (generalized): Secondary | ICD-10-CM | POA: Diagnosis not present

## 2016-09-12 DIAGNOSIS — R262 Difficulty in walking, not elsewhere classified: Secondary | ICD-10-CM | POA: Diagnosis not present

## 2016-09-12 DIAGNOSIS — M79671 Pain in right foot: Secondary | ICD-10-CM | POA: Diagnosis not present

## 2016-09-13 DIAGNOSIS — G8918 Other acute postprocedural pain: Secondary | ICD-10-CM | POA: Diagnosis not present

## 2016-09-13 DIAGNOSIS — J449 Chronic obstructive pulmonary disease, unspecified: Secondary | ICD-10-CM | POA: Diagnosis not present

## 2016-09-13 DIAGNOSIS — J45909 Unspecified asthma, uncomplicated: Secondary | ICD-10-CM | POA: Diagnosis not present

## 2016-09-13 DIAGNOSIS — M79671 Pain in right foot: Secondary | ICD-10-CM | POA: Diagnosis not present

## 2016-09-13 DIAGNOSIS — R262 Difficulty in walking, not elsewhere classified: Secondary | ICD-10-CM | POA: Diagnosis not present

## 2016-09-13 DIAGNOSIS — J439 Emphysema, unspecified: Secondary | ICD-10-CM | POA: Diagnosis not present

## 2016-09-13 DIAGNOSIS — G4733 Obstructive sleep apnea (adult) (pediatric): Secondary | ICD-10-CM | POA: Diagnosis not present

## 2016-09-13 DIAGNOSIS — M6281 Muscle weakness (generalized): Secondary | ICD-10-CM | POA: Diagnosis not present

## 2016-09-21 DIAGNOSIS — I872 Venous insufficiency (chronic) (peripheral): Secondary | ICD-10-CM | POA: Diagnosis not present

## 2016-09-21 DIAGNOSIS — R600 Localized edema: Secondary | ICD-10-CM | POA: Diagnosis not present

## 2016-09-21 DIAGNOSIS — J449 Chronic obstructive pulmonary disease, unspecified: Secondary | ICD-10-CM | POA: Diagnosis not present

## 2016-09-21 DIAGNOSIS — G4733 Obstructive sleep apnea (adult) (pediatric): Secondary | ICD-10-CM | POA: Diagnosis not present

## 2016-09-21 DIAGNOSIS — M8438XK Stress fracture, other site, subsequent encounter for fracture with nonunion: Secondary | ICD-10-CM | POA: Diagnosis not present

## 2016-09-21 DIAGNOSIS — J45909 Unspecified asthma, uncomplicated: Secondary | ICD-10-CM | POA: Diagnosis not present

## 2016-09-21 DIAGNOSIS — J439 Emphysema, unspecified: Secondary | ICD-10-CM | POA: Diagnosis not present

## 2016-09-21 DIAGNOSIS — M8430XA Stress fracture, unspecified site, initial encounter for fracture: Secondary | ICD-10-CM | POA: Diagnosis not present

## 2016-09-21 DIAGNOSIS — R269 Unspecified abnormalities of gait and mobility: Secondary | ICD-10-CM | POA: Diagnosis not present

## 2016-09-22 DIAGNOSIS — G4733 Obstructive sleep apnea (adult) (pediatric): Secondary | ICD-10-CM | POA: Diagnosis not present

## 2016-09-22 DIAGNOSIS — J439 Emphysema, unspecified: Secondary | ICD-10-CM | POA: Diagnosis not present

## 2016-09-22 DIAGNOSIS — J449 Chronic obstructive pulmonary disease, unspecified: Secondary | ICD-10-CM | POA: Diagnosis not present

## 2016-09-22 DIAGNOSIS — J45909 Unspecified asthma, uncomplicated: Secondary | ICD-10-CM | POA: Diagnosis not present

## 2016-09-28 DIAGNOSIS — R262 Difficulty in walking, not elsewhere classified: Secondary | ICD-10-CM | POA: Diagnosis not present

## 2016-09-28 DIAGNOSIS — M6281 Muscle weakness (generalized): Secondary | ICD-10-CM | POA: Diagnosis not present

## 2016-09-28 DIAGNOSIS — G8918 Other acute postprocedural pain: Secondary | ICD-10-CM | POA: Diagnosis not present

## 2016-09-28 DIAGNOSIS — M79671 Pain in right foot: Secondary | ICD-10-CM | POA: Diagnosis not present

## 2016-10-02 DIAGNOSIS — G8918 Other acute postprocedural pain: Secondary | ICD-10-CM | POA: Diagnosis not present

## 2016-10-02 DIAGNOSIS — M6281 Muscle weakness (generalized): Secondary | ICD-10-CM | POA: Diagnosis not present

## 2016-10-02 DIAGNOSIS — M79671 Pain in right foot: Secondary | ICD-10-CM | POA: Diagnosis not present

## 2016-10-02 DIAGNOSIS — R262 Difficulty in walking, not elsewhere classified: Secondary | ICD-10-CM | POA: Diagnosis not present

## 2016-10-03 DIAGNOSIS — J309 Allergic rhinitis, unspecified: Secondary | ICD-10-CM | POA: Diagnosis not present

## 2016-10-04 DIAGNOSIS — M8438XK Stress fracture, other site, subsequent encounter for fracture with nonunion: Secondary | ICD-10-CM | POA: Diagnosis not present

## 2016-10-04 DIAGNOSIS — G4733 Obstructive sleep apnea (adult) (pediatric): Secondary | ICD-10-CM | POA: Diagnosis not present

## 2016-10-04 DIAGNOSIS — R269 Unspecified abnormalities of gait and mobility: Secondary | ICD-10-CM | POA: Diagnosis not present

## 2016-10-04 DIAGNOSIS — J439 Emphysema, unspecified: Secondary | ICD-10-CM | POA: Diagnosis not present

## 2016-10-04 DIAGNOSIS — R600 Localized edema: Secondary | ICD-10-CM | POA: Diagnosis not present

## 2016-10-04 DIAGNOSIS — M8430XA Stress fracture, unspecified site, initial encounter for fracture: Secondary | ICD-10-CM | POA: Diagnosis not present

## 2016-10-04 DIAGNOSIS — I872 Venous insufficiency (chronic) (peripheral): Secondary | ICD-10-CM | POA: Diagnosis not present

## 2016-10-04 DIAGNOSIS — J449 Chronic obstructive pulmonary disease, unspecified: Secondary | ICD-10-CM | POA: Diagnosis not present

## 2016-10-04 DIAGNOSIS — J45909 Unspecified asthma, uncomplicated: Secondary | ICD-10-CM | POA: Diagnosis not present

## 2016-10-08 ENCOUNTER — Ambulatory Visit (INDEPENDENT_AMBULATORY_CARE_PROVIDER_SITE_OTHER): Payer: PPO

## 2016-10-08 ENCOUNTER — Encounter: Payer: Self-pay | Admitting: Podiatry

## 2016-10-08 ENCOUNTER — Ambulatory Visit (INDEPENDENT_AMBULATORY_CARE_PROVIDER_SITE_OTHER): Payer: Self-pay | Admitting: Podiatry

## 2016-10-08 DIAGNOSIS — Z9889 Other specified postprocedural states: Secondary | ICD-10-CM

## 2016-10-08 DIAGNOSIS — S92302K Fracture of unspecified metatarsal bone(s), left foot, subsequent encounter for fracture with nonunion: Secondary | ICD-10-CM

## 2016-10-08 MED ORDER — OXYCODONE-ACETAMINOPHEN 5-325 MG PO TABS: 1.0000 | ORAL_TABLET | Freq: Three times a day (TID) | ORAL | 0 refills | Status: DC | PRN

## 2016-10-09 NOTE — Progress Notes (Signed)
Subjective: Charles Wells is a 61 y.o. is seen today in office s/p right foot metatarsal ORIF with calcaneal bone graft preformed on 06/06/16. Since last appointment he has continued with PT, although he is only been able to go about once a week. He, and his family, have had viral illness which has left him at home more. At PT he states he has started to wear a sneaker and he has been gradually increasing his activity as well. He does take percocet intermittently for pain as he does get some discomfort with starting to be more active. He states "I already feel 1000 times better than I did before this surgery".  He does use the bone stimulator as well.  Denies any systemic complaints such as fevers, chills, nausea, vomiting. No calf pain, chest pain, shortness of breath.   Objective: General: No acute distress, AAOx3; presents today by himself. Presents in a CAM boot, weightbearing  DP/PT pulses palpable 2/4, CRT < 3 sec to all digits.  Protective sensation intact. Motor function intact.  Right foot: CAM boot intact Incision is well coapted without any evidence of dehiscence and a scar has formed. There is no tenderness along the surgical site on the right foot. I am able to palpate over the surgical sites and not able to elicit pain. There is trace edema to the area without any significant erythema or increase in warmth. No skin discoloration.  Motor function intact of the digits into the ankle. He also states he is starting to be able to curl his toes, which is something he has not been able to do. No other areas of tenderness bilaterally. There are no open lesions or pre-ulcerative lesions today. No pain with calf compression, swelling, warmth, erythema.         Assessment and Plan:  Status post right foot metatarsal ORIF, improving.   -Treatment options discussed including all alternatives, risks, and complications -X-rays were obtained and reviewed with the patient. Hardware intact. There  does appear to be some increased consolidation across the fracture sites. Hardware is intact. X-rays were again discussed today with him.  -Recommended to continue to transition to WBAT in a regular shoe. Continue with PT.  -Refilled pain medication today. Hopefully we can get him back to his regular Vicodin soon and off of percocet.  -Continue with bone stimulator -Continue ice and elevation -Follow-up as scheduled or sooner if any problems arise. In the meantime, encouraged to call the office with any questions, concerns, change in symptoms.   Celesta Gentile, DPM

## 2016-10-14 DIAGNOSIS — J45909 Unspecified asthma, uncomplicated: Secondary | ICD-10-CM | POA: Diagnosis not present

## 2016-10-14 DIAGNOSIS — G4733 Obstructive sleep apnea (adult) (pediatric): Secondary | ICD-10-CM | POA: Diagnosis not present

## 2016-10-14 DIAGNOSIS — J449 Chronic obstructive pulmonary disease, unspecified: Secondary | ICD-10-CM | POA: Diagnosis not present

## 2016-10-14 DIAGNOSIS — J439 Emphysema, unspecified: Secondary | ICD-10-CM | POA: Diagnosis not present

## 2016-10-16 DIAGNOSIS — G8918 Other acute postprocedural pain: Secondary | ICD-10-CM | POA: Diagnosis not present

## 2016-10-16 DIAGNOSIS — R262 Difficulty in walking, not elsewhere classified: Secondary | ICD-10-CM | POA: Diagnosis not present

## 2016-10-16 DIAGNOSIS — M6281 Muscle weakness (generalized): Secondary | ICD-10-CM | POA: Diagnosis not present

## 2016-10-16 DIAGNOSIS — M79671 Pain in right foot: Secondary | ICD-10-CM | POA: Diagnosis not present

## 2016-10-17 DIAGNOSIS — J301 Allergic rhinitis due to pollen: Secondary | ICD-10-CM | POA: Diagnosis not present

## 2016-10-17 DIAGNOSIS — F419 Anxiety disorder, unspecified: Secondary | ICD-10-CM | POA: Diagnosis not present

## 2016-10-17 DIAGNOSIS — M545 Low back pain: Secondary | ICD-10-CM | POA: Diagnosis not present

## 2016-10-17 DIAGNOSIS — M159 Polyosteoarthritis, unspecified: Secondary | ICD-10-CM | POA: Diagnosis not present

## 2016-10-17 DIAGNOSIS — J4530 Mild persistent asthma, uncomplicated: Secondary | ICD-10-CM | POA: Diagnosis not present

## 2016-10-17 DIAGNOSIS — G4733 Obstructive sleep apnea (adult) (pediatric): Secondary | ICD-10-CM | POA: Diagnosis not present

## 2016-10-19 DIAGNOSIS — G8918 Other acute postprocedural pain: Secondary | ICD-10-CM | POA: Diagnosis not present

## 2016-10-19 DIAGNOSIS — R262 Difficulty in walking, not elsewhere classified: Secondary | ICD-10-CM | POA: Diagnosis not present

## 2016-10-19 DIAGNOSIS — M79671 Pain in right foot: Secondary | ICD-10-CM | POA: Diagnosis not present

## 2016-10-19 DIAGNOSIS — M6281 Muscle weakness (generalized): Secondary | ICD-10-CM | POA: Diagnosis not present

## 2016-11-14 DIAGNOSIS — J45909 Unspecified asthma, uncomplicated: Secondary | ICD-10-CM | POA: Diagnosis not present

## 2016-11-14 DIAGNOSIS — J449 Chronic obstructive pulmonary disease, unspecified: Secondary | ICD-10-CM | POA: Diagnosis not present

## 2016-11-14 DIAGNOSIS — G4733 Obstructive sleep apnea (adult) (pediatric): Secondary | ICD-10-CM | POA: Diagnosis not present

## 2016-11-14 DIAGNOSIS — J309 Allergic rhinitis, unspecified: Secondary | ICD-10-CM | POA: Diagnosis not present

## 2016-11-14 DIAGNOSIS — J439 Emphysema, unspecified: Secondary | ICD-10-CM | POA: Diagnosis not present

## 2016-11-15 DIAGNOSIS — G8918 Other acute postprocedural pain: Secondary | ICD-10-CM | POA: Diagnosis not present

## 2016-11-15 DIAGNOSIS — R262 Difficulty in walking, not elsewhere classified: Secondary | ICD-10-CM | POA: Diagnosis not present

## 2016-11-15 DIAGNOSIS — M79671 Pain in right foot: Secondary | ICD-10-CM | POA: Diagnosis not present

## 2016-11-15 DIAGNOSIS — M6281 Muscle weakness (generalized): Secondary | ICD-10-CM | POA: Diagnosis not present

## 2016-11-19 ENCOUNTER — Ambulatory Visit: Payer: PPO

## 2016-11-23 ENCOUNTER — Ambulatory Visit (INDEPENDENT_AMBULATORY_CARE_PROVIDER_SITE_OTHER): Payer: PPO | Admitting: Podiatry

## 2016-11-23 ENCOUNTER — Ambulatory Visit (INDEPENDENT_AMBULATORY_CARE_PROVIDER_SITE_OTHER): Payer: PPO

## 2016-11-23 DIAGNOSIS — Z9889 Other specified postprocedural states: Secondary | ICD-10-CM | POA: Diagnosis not present

## 2016-11-23 DIAGNOSIS — S92302K Fracture of unspecified metatarsal bone(s), left foot, subsequent encounter for fracture with nonunion: Secondary | ICD-10-CM

## 2016-11-23 MED ORDER — OXYCODONE-ACETAMINOPHEN 5-325 MG PO TABS: 1.0000 | ORAL_TABLET | Freq: Three times a day (TID) | ORAL | 0 refills | Status: DC | PRN

## 2016-11-26 NOTE — Progress Notes (Signed)
Subjective: Charles Wells is a 61 y.o. is seen today in office s/p right foot metatarsal ORIF with calcaneal bone graft preformed on 06/06/16. His continue with physical therapy but he has been intermittent with this as he is been sick and he did miss a couple weeks. He also states that he has not been as continuous with the bone stimulator. He has returned to the boot/shoe although they still using the crutches he states that he is scared injuring the foot again. Overall he states that he is doing well and he is doing much better compared to before surgery and so far he is happy with the result.  Denies any systemic complaints such as fevers, chills, nausea, vomiting. No calf pain, chest pain, shortness of breath.   Objective: General: No acute distress, AAOx3; presents today by himself. Presents in a CAM boot, weightbearing  DP/PT pulses palpable 2/4, CRT < 3 sec to all digits.  Protective sensation intact. Motor function intact.  Right foot: CAM boot intact Incision is well coapted without any evidence of dehiscence and a scar is well healed. There is no tenderness along the surgical site on the right foot. There doesn't be some mild discomfort on the fourth and fifth metatarsal cuboid base. There is no edema to this area or erythema. Minimal edema to the foot there is no erythema or increase in warmth. There is no skin breakdown. There is normal skin coloration and temperature. He is able to curl his toes better however the second toe is still somewhat limited with this. He states that during physical therapy he is able to pick up marbles now.  There are no open lesions or pre-ulcerative lesions today. No pain with calf compression, swelling, warmth, erythema.   Assessment and Plan:  Status post right foot metatarsal ORIF, improving.   -Treatment options discussed including all alternatives, risks, and complications -X-rays were obtained and reviewed with the patient. Hardware intact. There does  appear to be increased consolidation across the fracture sites. Hardware is intact. X-rays were again discussed today with him.  -At this time discussed and continued physical therapy. He is making slow but steady progress. -Able to transition to a regular shoe full time as able and to wean off of the using any assistive devices. -Continue bone stimulator. -Continue ice and elevation -Follow-up as scheduled or sooner if any problems arise. In the meantime, encouraged to call the office with any questions, concerns, change in symptoms.   Celesta Gentile, DPM

## 2016-11-27 DIAGNOSIS — M6281 Muscle weakness (generalized): Secondary | ICD-10-CM | POA: Diagnosis not present

## 2016-11-27 DIAGNOSIS — R262 Difficulty in walking, not elsewhere classified: Secondary | ICD-10-CM | POA: Diagnosis not present

## 2016-11-27 DIAGNOSIS — M79671 Pain in right foot: Secondary | ICD-10-CM | POA: Diagnosis not present

## 2016-11-27 DIAGNOSIS — G8918 Other acute postprocedural pain: Secondary | ICD-10-CM | POA: Diagnosis not present

## 2016-11-28 DIAGNOSIS — J309 Allergic rhinitis, unspecified: Secondary | ICD-10-CM | POA: Diagnosis not present

## 2016-11-29 DIAGNOSIS — M79671 Pain in right foot: Secondary | ICD-10-CM | POA: Diagnosis not present

## 2016-11-29 DIAGNOSIS — M6281 Muscle weakness (generalized): Secondary | ICD-10-CM | POA: Diagnosis not present

## 2016-11-29 DIAGNOSIS — G8918 Other acute postprocedural pain: Secondary | ICD-10-CM | POA: Diagnosis not present

## 2016-11-29 DIAGNOSIS — R262 Difficulty in walking, not elsewhere classified: Secondary | ICD-10-CM | POA: Diagnosis not present

## 2016-12-04 DIAGNOSIS — M79671 Pain in right foot: Secondary | ICD-10-CM | POA: Diagnosis not present

## 2016-12-04 DIAGNOSIS — M6281 Muscle weakness (generalized): Secondary | ICD-10-CM | POA: Diagnosis not present

## 2016-12-04 DIAGNOSIS — R262 Difficulty in walking, not elsewhere classified: Secondary | ICD-10-CM | POA: Diagnosis not present

## 2016-12-04 DIAGNOSIS — G8918 Other acute postprocedural pain: Secondary | ICD-10-CM | POA: Diagnosis not present

## 2016-12-06 DIAGNOSIS — R262 Difficulty in walking, not elsewhere classified: Secondary | ICD-10-CM | POA: Diagnosis not present

## 2016-12-06 DIAGNOSIS — G8918 Other acute postprocedural pain: Secondary | ICD-10-CM | POA: Diagnosis not present

## 2016-12-06 DIAGNOSIS — M6281 Muscle weakness (generalized): Secondary | ICD-10-CM | POA: Diagnosis not present

## 2016-12-06 DIAGNOSIS — M79671 Pain in right foot: Secondary | ICD-10-CM | POA: Diagnosis not present

## 2016-12-11 DIAGNOSIS — M79671 Pain in right foot: Secondary | ICD-10-CM | POA: Diagnosis not present

## 2016-12-11 DIAGNOSIS — R262 Difficulty in walking, not elsewhere classified: Secondary | ICD-10-CM | POA: Diagnosis not present

## 2016-12-11 DIAGNOSIS — M6281 Muscle weakness (generalized): Secondary | ICD-10-CM | POA: Diagnosis not present

## 2016-12-11 DIAGNOSIS — G8918 Other acute postprocedural pain: Secondary | ICD-10-CM | POA: Diagnosis not present

## 2016-12-12 DIAGNOSIS — J45909 Unspecified asthma, uncomplicated: Secondary | ICD-10-CM | POA: Diagnosis not present

## 2016-12-12 DIAGNOSIS — G4733 Obstructive sleep apnea (adult) (pediatric): Secondary | ICD-10-CM | POA: Diagnosis not present

## 2016-12-12 DIAGNOSIS — J439 Emphysema, unspecified: Secondary | ICD-10-CM | POA: Diagnosis not present

## 2016-12-12 DIAGNOSIS — J449 Chronic obstructive pulmonary disease, unspecified: Secondary | ICD-10-CM | POA: Diagnosis not present

## 2016-12-13 DIAGNOSIS — M79671 Pain in right foot: Secondary | ICD-10-CM | POA: Diagnosis not present

## 2016-12-13 DIAGNOSIS — R262 Difficulty in walking, not elsewhere classified: Secondary | ICD-10-CM | POA: Diagnosis not present

## 2016-12-13 DIAGNOSIS — M6281 Muscle weakness (generalized): Secondary | ICD-10-CM | POA: Diagnosis not present

## 2016-12-13 DIAGNOSIS — G8918 Other acute postprocedural pain: Secondary | ICD-10-CM | POA: Diagnosis not present

## 2016-12-18 DIAGNOSIS — G8918 Other acute postprocedural pain: Secondary | ICD-10-CM | POA: Diagnosis not present

## 2016-12-18 DIAGNOSIS — M6281 Muscle weakness (generalized): Secondary | ICD-10-CM | POA: Diagnosis not present

## 2016-12-18 DIAGNOSIS — R262 Difficulty in walking, not elsewhere classified: Secondary | ICD-10-CM | POA: Diagnosis not present

## 2016-12-18 DIAGNOSIS — M79671 Pain in right foot: Secondary | ICD-10-CM | POA: Diagnosis not present

## 2016-12-27 DIAGNOSIS — R262 Difficulty in walking, not elsewhere classified: Secondary | ICD-10-CM | POA: Diagnosis not present

## 2016-12-27 DIAGNOSIS — G8918 Other acute postprocedural pain: Secondary | ICD-10-CM | POA: Diagnosis not present

## 2016-12-27 DIAGNOSIS — M79671 Pain in right foot: Secondary | ICD-10-CM | POA: Diagnosis not present

## 2016-12-27 DIAGNOSIS — M6281 Muscle weakness (generalized): Secondary | ICD-10-CM | POA: Diagnosis not present

## 2016-12-28 ENCOUNTER — Ambulatory Visit (INDEPENDENT_AMBULATORY_CARE_PROVIDER_SITE_OTHER): Payer: PPO

## 2016-12-28 ENCOUNTER — Ambulatory Visit (INDEPENDENT_AMBULATORY_CARE_PROVIDER_SITE_OTHER): Payer: Self-pay | Admitting: Podiatry

## 2016-12-28 ENCOUNTER — Encounter: Payer: Self-pay | Admitting: Podiatry

## 2016-12-28 DIAGNOSIS — Z9889 Other specified postprocedural states: Secondary | ICD-10-CM

## 2016-12-29 NOTE — Progress Notes (Signed)
Subjective: Charles Wells is a 61 y.o. is seen today in office s/p right foot metatarsal ORIF with calcaneal bone graft preformed on 06/06/16. He has returned to regular shoe he is doing well. He also continues with physical therapy. He states overall he is doing better but after results feet for a long time he starts to get swelling. He is been driving and doing more normal activities without any pain. Overall he feels that he is, along nicely and is happy so far.  Denies any systemic complaints such as fevers, chills, nausea, vomiting. No calf pain, chest pain, shortness of breath.   Objective: General: No acute distress, AAOx3; presents today by himself. Presents in a CAM boot, weightbearing  DP/PT pulses palpable 2/4, CRT < 3 sec to all digits.  Protective sensation intact. Motor function intact.  Right foot: CAM boot intact Incision is well coapted without any evidence of dehiscence and a scar is well healed. I'm unable to elicit any tenderness to palpation of the surgical sites. There is some mild tenderness on the lateral aspect of the foot which does continue along the Lisfranc joint however minimal today. There is trace edema to the foot there is no erythema or increase in warmth. There is no other areas of tenderness and notified bilaterally. He is wearing his orthotics today in his shoes. Cavus foot type is present. There are no open lesions or pre-ulcerative lesions today. No pain with calf compression, swelling, warmth, erythema.   Assessment and Plan:  Status post right foot metatarsal ORIF, improving.   -Treatment options discussed including all alternatives, risks, and complications -X-rays were obtained and reviewed with the patient. Hardware intact. There does continue to be increased consolidation across the fracture sites. Hardware is intact.  -I did modify his orthotics at a lateral post to see if this will help him from putting more weight the outside aspect of his  foot. -Continue with physical therapy to increase overall general strength and conditioning and rehabilitation. -Continue supportive shoe gear. Continue with compression sock. Ice to the area. -Continue with bone stimulator -Discussed the right and a motorcycle and I think the next couple weeks he can try to do a short distances see how he feels. He is able to drive a car and do other activities without any pains we will try this next. -Follow-up as scheduled or sooner if needed. Call any questions concerns meantime.  Celesta Gentile, DPM

## 2017-01-01 ENCOUNTER — Telehealth: Payer: Self-pay | Admitting: Podiatry

## 2017-01-01 ENCOUNTER — Encounter: Payer: Self-pay | Admitting: Podiatry

## 2017-01-01 DIAGNOSIS — G8918 Other acute postprocedural pain: Secondary | ICD-10-CM | POA: Diagnosis not present

## 2017-01-01 DIAGNOSIS — R262 Difficulty in walking, not elsewhere classified: Secondary | ICD-10-CM | POA: Diagnosis not present

## 2017-01-01 DIAGNOSIS — M6281 Muscle weakness (generalized): Secondary | ICD-10-CM | POA: Diagnosis not present

## 2017-01-01 DIAGNOSIS — M79671 Pain in right foot: Secondary | ICD-10-CM | POA: Diagnosis not present

## 2017-01-01 NOTE — Progress Notes (Signed)
Put printed out x-rays up front to be mailed to patient since they were not available when he was in the office on Friday 06 April.

## 2017-01-01 NOTE — Telephone Encounter (Signed)
Called patient and left him a voicemail letting him know that our RN Janett Billow Q fixed the problem we were having with the x-rays and that I was able to print his x-rays he had taken during his office visit Friday 06 April and that he should have them in the mail no later than this Friday 13 April. Told patient to call us with any other questions or concerns.

## 2017-01-10 ENCOUNTER — Telehealth: Payer: Self-pay | Admitting: *Deleted
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
## 2017-01-12 DIAGNOSIS — J449 Chronic obstructive pulmonary disease, unspecified: Secondary | ICD-10-CM | POA: Diagnosis not present

## 2017-01-12 DIAGNOSIS — J45909 Unspecified asthma, uncomplicated: Secondary | ICD-10-CM | POA: Diagnosis not present

## 2017-01-12 DIAGNOSIS — J439 Emphysema, unspecified: Secondary | ICD-10-CM | POA: Diagnosis not present

## 2017-01-12 DIAGNOSIS — G4733 Obstructive sleep apnea (adult) (pediatric): Secondary | ICD-10-CM | POA: Diagnosis not present

## 2017-01-15 DIAGNOSIS — G4733 Obstructive sleep apnea (adult) (pediatric): Secondary | ICD-10-CM | POA: Diagnosis not present

## 2017-01-23 DIAGNOSIS — R5383 Other fatigue: Secondary | ICD-10-CM | POA: Diagnosis not present

## 2017-01-23 DIAGNOSIS — G4733 Obstructive sleep apnea (adult) (pediatric): Secondary | ICD-10-CM | POA: Diagnosis not present

## 2017-01-23 DIAGNOSIS — J4540 Moderate persistent asthma, uncomplicated: Secondary | ICD-10-CM | POA: Diagnosis not present

## 2017-01-23 DIAGNOSIS — J301 Allergic rhinitis due to pollen: Secondary | ICD-10-CM | POA: Diagnosis not present

## 2017-01-23 DIAGNOSIS — F17210 Nicotine dependence, cigarettes, uncomplicated: Secondary | ICD-10-CM | POA: Diagnosis not present

## 2017-02-05 DIAGNOSIS — J309 Allergic rhinitis, unspecified: Secondary | ICD-10-CM | POA: Diagnosis not present

## 2017-02-07 ENCOUNTER — Ambulatory Visit (INDEPENDENT_AMBULATORY_CARE_PROVIDER_SITE_OTHER): Payer: PPO | Admitting: Podiatry

## 2017-02-07 ENCOUNTER — Encounter: Payer: Self-pay | Admitting: Podiatry

## 2017-02-07 DIAGNOSIS — M722 Plantar fascial fibromatosis: Secondary | ICD-10-CM

## 2017-02-07 DIAGNOSIS — M216X9 Other acquired deformities of unspecified foot: Secondary | ICD-10-CM

## 2017-02-07 DIAGNOSIS — S92302K Fracture of unspecified metatarsal bone(s), left foot, subsequent encounter for fracture with nonunion: Secondary | ICD-10-CM

## 2017-02-07 MED ORDER — HYDROCODONE-ACETAMINOPHEN 10-325 MG PO TABS: 1.0000 | ORAL_TABLET | ORAL | 0 refills | Status: DC | PRN

## 2017-02-08 ENCOUNTER — Ambulatory Visit: Payer: PPO | Admitting: Podiatry

## 2017-02-10 NOTE — Progress Notes (Signed)
Subjective: Charles Wells is a 61 y.o. is seen today in office s/p right foot metatarsal ORIF with calcaneal bone graft preformed on 06/06/16. He says is doing well and slowly improving. The infant today he still gets some discomfort and swelling this mostly to the lateral aspect of the foot as he feels that he is still growing outwards when he walks. She is not related to the surgery. He also states he started to get some pain to the heels well. Even before this injury he was having heel pain was getting plantar fasciitis. He states he's had injections for the heel pain previously and this is done well. He denies any recent injury or trauma. He has not been as active as he would like to be because of his friend having cataract surgery and he is been taking care of her. Denies any systemic complaints such as fevers, chills, nausea, vomiting. No calf pain, chest pain, shortness of breath.   Objective: General: No acute distress, AAOx3; presents today by himself. Presents in a CAM boot, weightbearing  DP/PT pulses palpable 2/4, CRT < 3 sec to all digits.  Protective sensation intact. Motor function intact.  Right foot: CAM boot intact Incision is well coapted without any evidence of dehiscence and a scar is well healed. I'm still unable to elicit any tenderness to palpation of the surgical sites. There is some mild tenderness on the lateral aspect of the foot which does continue along the Lisfranc joint he does walk the lateral aspect of his foot many this is contributing to this. Also he wears a cowboy boots when he look at the boot he completely rolls outwards when he walks and wears down the outside aspect of the boot significantly. There is tenderness palpation along the plantar medial tubercle of the calcaneus at insertion the plantar fashion the right foot. Plantar fascia the arch of the foot. There is minimal edema to the foot that is not significant. This no erythema or increase in warmth. There is no  pain with lateral compression of the calcaneus there is no other area pinpoint tenderness.  Cavus foot type is present. There are no open lesions or pre-ulcerative lesions today. No pain with calf compression, swelling, warmth, erythema.   Assessment and Plan:  Status post right foot metatarsal ORIF, improving; plantar fasciitis; cavus foot type  -Treatment options discussed including all alternatives, risks, and complications -Patient elects to proceed with steroid injection into the right heel. Under sterile skin preparation, a total of 2.5cc of kenalog 10, 0.5% Marcaine plain, and 2% lidocaine plain were infiltrated into the symptomatic area without complication. A band-aid was applied. Patient tolerated the injection well without complication. Post-injection care with discussed with the patient. Discussed with the patient to ice the area over the next couple of days to help prevent a steroid flare. Continue stretching and icing daily as well. -I also had with evaluate the patient for his orthotics. I want to keep him from rolling out more which is contributing to his lateral foot pain. This is away from the surgical site where he gets pain.  -Regards to surgical standpoint he is doing well. He gradually increase activity. However get him to continue with physical therapy just given his overall foot pain as well as heel pain. He has not been able to go to physical therapy last month and return to this.  Celesta Gentile, DPM

## 2017-02-11 DIAGNOSIS — J449 Chronic obstructive pulmonary disease, unspecified: Secondary | ICD-10-CM | POA: Diagnosis not present

## 2017-02-11 DIAGNOSIS — G4733 Obstructive sleep apnea (adult) (pediatric): Secondary | ICD-10-CM | POA: Diagnosis not present

## 2017-02-11 DIAGNOSIS — J45909 Unspecified asthma, uncomplicated: Secondary | ICD-10-CM | POA: Diagnosis not present

## 2017-02-11 DIAGNOSIS — J439 Emphysema, unspecified: Secondary | ICD-10-CM | POA: Diagnosis not present

## 2017-02-12 DIAGNOSIS — M79671 Pain in right foot: Secondary | ICD-10-CM | POA: Diagnosis not present

## 2017-02-12 DIAGNOSIS — M6281 Muscle weakness (generalized): Secondary | ICD-10-CM | POA: Diagnosis not present

## 2017-02-12 DIAGNOSIS — G8918 Other acute postprocedural pain: Secondary | ICD-10-CM | POA: Diagnosis not present

## 2017-02-12 DIAGNOSIS — R262 Difficulty in walking, not elsewhere classified: Secondary | ICD-10-CM | POA: Diagnosis not present

## 2017-03-06 DIAGNOSIS — D3132 Benign neoplasm of left choroid: Secondary | ICD-10-CM | POA: Diagnosis not present

## 2017-03-07 ENCOUNTER — Ambulatory Visit (INDEPENDENT_AMBULATORY_CARE_PROVIDER_SITE_OTHER): Payer: PPO | Admitting: Podiatry

## 2017-03-07 ENCOUNTER — Encounter: Payer: Self-pay | Admitting: Podiatry

## 2017-03-07 DIAGNOSIS — M722 Plantar fascial fibromatosis: Secondary | ICD-10-CM | POA: Diagnosis not present

## 2017-03-07 DIAGNOSIS — S92302K Fracture of unspecified metatarsal bone(s), left foot, subsequent encounter for fracture with nonunion: Secondary | ICD-10-CM

## 2017-03-07 MED ORDER — HYDROCODONE-ACETAMINOPHEN 10-325 MG PO TABS: 1.0000 | ORAL_TABLET | Freq: Four times a day (QID) | ORAL | 0 refills | Status: DC | PRN

## 2017-03-07 MED ORDER — TRIAMCINOLONE ACETONIDE 10 MG/ML IJ SUSP: 10.0000 mg | Freq: Once | INTRAMUSCULAR | Status: AC
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
## 2017-03-12 ENCOUNTER — Encounter: Payer: Self-pay | Admitting: Podiatry

## 2017-03-12 NOTE — Progress Notes (Signed)
Orthotics not covered by insurance. Will try for diabetic shoes given his cavus foot type, diabetes with neuropathy and hammertoes. Paperwork was completed for pre-certification.

## 2017-03-14 DIAGNOSIS — G4733 Obstructive sleep apnea (adult) (pediatric): Secondary | ICD-10-CM | POA: Diagnosis not present

## 2017-03-14 DIAGNOSIS — J449 Chronic obstructive pulmonary disease, unspecified: Secondary | ICD-10-CM | POA: Diagnosis not present

## 2017-03-14 DIAGNOSIS — J45909 Unspecified asthma, uncomplicated: Secondary | ICD-10-CM | POA: Diagnosis not present

## 2017-03-14 DIAGNOSIS — J439 Emphysema, unspecified: Secondary | ICD-10-CM | POA: Diagnosis not present

## 2017-03-15 ENCOUNTER — Telehealth: Payer: Self-pay | Admitting: *Deleted

## 2017-03-15 NOTE — Telephone Encounter (Signed)
I faxed health team advantage for the patient to get approval for either or diabetic shoes and inserts or custom molded inserts and the Fax number is [phone]. Charles Wells

## 2017-04-01 DIAGNOSIS — C61 Malignant neoplasm of prostate: Secondary | ICD-10-CM | POA: Diagnosis not present

## 2017-04-08 ENCOUNTER — Ambulatory Visit: Payer: PPO | Admitting: Orthotics

## 2017-04-08 DIAGNOSIS — N5201 Erectile dysfunction due to arterial insufficiency: Secondary | ICD-10-CM | POA: Diagnosis not present

## 2017-04-08 DIAGNOSIS — C61 Malignant neoplasm of prostate: Secondary | ICD-10-CM | POA: Diagnosis not present

## 2017-04-08 DIAGNOSIS — R350 Frequency of micturition: Secondary | ICD-10-CM | POA: Diagnosis not present

## 2017-04-08 DIAGNOSIS — N401 Enlarged prostate with lower urinary tract symptoms: Secondary | ICD-10-CM | POA: Diagnosis not present

## 2017-04-10 DIAGNOSIS — M159 Polyosteoarthritis, unspecified: Secondary | ICD-10-CM | POA: Diagnosis not present

## 2017-04-10 DIAGNOSIS — M79673 Pain in unspecified foot: Secondary | ICD-10-CM | POA: Diagnosis not present

## 2017-04-10 DIAGNOSIS — I10 Essential (primary) hypertension: Secondary | ICD-10-CM | POA: Diagnosis not present

## 2017-04-10 DIAGNOSIS — Z760 Encounter for issue of repeat prescription: Secondary | ICD-10-CM | POA: Diagnosis not present

## 2017-04-11 ENCOUNTER — Ambulatory Visit (INDEPENDENT_AMBULATORY_CARE_PROVIDER_SITE_OTHER): Payer: PPO | Admitting: Orthotics

## 2017-04-11 DIAGNOSIS — E0821 Diabetes mellitus due to underlying condition with diabetic nephropathy: Secondary | ICD-10-CM

## 2017-04-11 DIAGNOSIS — M216X9 Other acquired deformities of unspecified foot: Secondary | ICD-10-CM

## 2017-04-11 DIAGNOSIS — M722 Plantar fascial fibromatosis: Secondary | ICD-10-CM

## 2017-04-11 NOTE — Progress Notes (Signed)
Patient came in today for fitting and eval for diabetic shoes: Patient' doctor here is Designer, industrial/product  Patient presents with DM2, hx of foot deformity  Patient was measured with brannock device and cast in foam for custom inserts.  Patient chose S221-1  9.5W  Patient is being seen by a PA and told that Medicare would not cover it.  Patient is adamant that [place] will cover it.

## 2017-04-13 DIAGNOSIS — J45909 Unspecified asthma, uncomplicated: Secondary | ICD-10-CM | POA: Diagnosis not present

## 2017-04-13 DIAGNOSIS — J449 Chronic obstructive pulmonary disease, unspecified: Secondary | ICD-10-CM | POA: Diagnosis not present

## 2017-04-13 DIAGNOSIS — J439 Emphysema, unspecified: Secondary | ICD-10-CM | POA: Diagnosis not present

## 2017-04-13 DIAGNOSIS — G4733 Obstructive sleep apnea (adult) (pediatric): Secondary | ICD-10-CM | POA: Diagnosis not present

## 2017-05-02 ENCOUNTER — Ambulatory Visit: Payer: PPO | Admitting: Podiatry

## 2017-05-08 DIAGNOSIS — M159 Polyosteoarthritis, unspecified: Secondary | ICD-10-CM | POA: Diagnosis not present

## 2017-05-08 DIAGNOSIS — S92309A Fracture of unspecified metatarsal bone(s), unspecified foot, initial encounter for closed fracture: Secondary | ICD-10-CM | POA: Diagnosis not present

## 2017-05-08 DIAGNOSIS — F419 Anxiety disorder, unspecified: Secondary | ICD-10-CM | POA: Diagnosis not present

## 2017-05-15 ENCOUNTER — Telehealth: Payer: Self-pay | Admitting: Podiatry

## 2017-05-15 NOTE — Telephone Encounter (Signed)
Left message for pt to call me back that we need to cxl appt for [phone] to puds and I have a question requarding his doctor that treats his diabetes.

## 2017-05-15 NOTE — Telephone Encounter (Signed)
Pt called back and I tried several times to explain that we had to have a md sign the paperwork and have a note attached from an ov with the doctor within 6 months and pt said he called insurance company and got authorization for the diabetic shoes and inserts already. Pt stated he told the insurance company he sees Roque Cash [place] as his pcp and they told him it was ok and he would be responsible for 20%.

## 2017-05-16 ENCOUNTER — Ambulatory Visit: Payer: PPO | Admitting: Podiatry

## 2017-05-16 ENCOUNTER — Ambulatory Visit: Payer: PPO | Admitting: Orthotics

## 2017-05-17 ENCOUNTER — Telehealth: Payer: Self-pay | Admitting: Podiatry

## 2017-05-21 NOTE — Telephone Encounter (Signed)
I had called Charles Wells back on last week and discussed this with him. He states that he is doing better to his feet. He has been feeling a little run down recently but doing well to the foot. His only concern was not getting his shoes and inserts. I found the authorization in the chart and discussed with our office manager who also conacted Charles Wells. Per Charles Wells it is OK to release in order to get him the shoes and inserts.

## 2017-05-21 NOTE — Telephone Encounter (Signed)
The shoes and inserts are being released by [employer].

## 2017-05-31 ENCOUNTER — Encounter: Payer: Self-pay | Admitting: Podiatry

## 2017-05-31 ENCOUNTER — Ambulatory Visit (INDEPENDENT_AMBULATORY_CARE_PROVIDER_SITE_OTHER): Payer: PPO | Admitting: Podiatry

## 2017-05-31 DIAGNOSIS — E0821 Diabetes mellitus due to underlying condition with diabetic nephropathy: Secondary | ICD-10-CM | POA: Diagnosis not present

## 2017-05-31 DIAGNOSIS — S92302K Fracture of unspecified metatarsal bone(s), left foot, subsequent encounter for fracture with nonunion: Secondary | ICD-10-CM

## 2017-05-31 DIAGNOSIS — M216X9 Other acquired deformities of unspecified foot: Secondary | ICD-10-CM
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
## 2017-05-31 NOTE — Patient Instructions (Signed)

## 2017-06-12 NOTE — Telephone Encounter (Signed)
Error

## 2017-07-20 ENCOUNTER — Emergency Department: Payer: No Typology Code available for payment source

## 2017-07-20 ENCOUNTER — Encounter: Payer: Self-pay | Admitting: Emergency Medicine

## 2017-07-20 ENCOUNTER — Emergency Department
Admission: EM | Admit: 2017-07-20 | Discharge: 2017-07-21 | Disposition: A | Discharge status: Home / Self Care | Payer: No Typology Code available for payment source | Attending: Emergency Medicine | Admitting: Emergency Medicine

## 2017-07-20 DIAGNOSIS — Z7984 Long term (current) use of oral hypoglycemic drugs: Secondary | ICD-10-CM | POA: Diagnosis not present

## 2017-07-20 DIAGNOSIS — Z79899 Other long term (current) drug therapy: Secondary | ICD-10-CM | POA: Insufficient documentation

## 2017-07-20 DIAGNOSIS — Y998 Other external cause status: Secondary | ICD-10-CM | POA: Insufficient documentation

## 2017-07-20 DIAGNOSIS — Y939 Activity, unspecified: Secondary | ICD-10-CM | POA: Diagnosis not present

## 2017-07-20 DIAGNOSIS — E7800 Pure hypercholesterolemia, unspecified: Secondary | ICD-10-CM | POA: Insufficient documentation

## 2017-07-20 DIAGNOSIS — R51 Headache: Secondary | ICD-10-CM | POA: Insufficient documentation

## 2017-07-20 DIAGNOSIS — S62514A Nondisplaced fracture of proximal phalanx of right thumb, initial encounter for closed fracture: Secondary | ICD-10-CM

## 2017-07-20 DIAGNOSIS — R0789 Other chest pain: Secondary | ICD-10-CM | POA: Diagnosis not present

## 2017-07-20 DIAGNOSIS — Y92410 Unspecified street and highway as the place of occurrence of the external cause: Secondary | ICD-10-CM | POA: Diagnosis not present

## 2017-07-20 DIAGNOSIS — S0990XA Unspecified injury of head, initial encounter: Secondary | ICD-10-CM | POA: Diagnosis not present

## 2017-07-20 DIAGNOSIS — J449 Chronic obstructive pulmonary disease, unspecified: Secondary | ICD-10-CM | POA: Insufficient documentation

## 2017-07-20 DIAGNOSIS — R079 Chest pain, unspecified: Secondary | ICD-10-CM | POA: Diagnosis not present

## 2017-07-20 DIAGNOSIS — S199XXA Unspecified injury of neck, initial encounter: Secondary | ICD-10-CM | POA: Diagnosis not present

## 2017-07-20 DIAGNOSIS — E119 Type 2 diabetes mellitus without complications: Secondary | ICD-10-CM | POA: Diagnosis not present

## 2017-07-20 DIAGNOSIS — F17210 Nicotine dependence, cigarettes, uncomplicated: Secondary | ICD-10-CM | POA: Diagnosis not present

## 2017-07-20 DIAGNOSIS — I10 Essential (primary) hypertension: Secondary | ICD-10-CM | POA: Diagnosis not present

## 2017-07-20 DIAGNOSIS — M79644 Pain in right finger(s): Secondary | ICD-10-CM | POA: Diagnosis not present

## 2017-07-20 DIAGNOSIS — M542 Cervicalgia: Secondary | ICD-10-CM | POA: Diagnosis not present

## 2017-07-20 DIAGNOSIS — S6992XA Unspecified injury of left wrist, hand and finger(s), initial encounter: Secondary | ICD-10-CM | POA: Diagnosis present

## 2017-07-20 LAB — TROPONIN I: TROPONIN I: 0.04 ng/mL — AB (ref <0.03)

## 2017-07-20 LAB — BASIC METABOLIC PANEL
ANION GAP: 13 (ref 5–15)
BUN: 13 mg/dL (ref 6–20)
CALCIUM: 9.4 mg/dL (ref 8.9–10.3)
CO2: 27 mmol/L (ref 22–32)
Chloride: 98 mmol/L — ABNORMAL LOW (ref 101–111)
Creatinine, Ser: 0.78 mg/dL (ref 0.61–1.24)
GLUCOSE: 102 mg/dL — AB (ref 65–99)
POTASSIUM: 2.9 mmol/L — AB (ref 3.5–5.1)
SODIUM: 138 mmol/L (ref 135–145)

## 2017-07-20 LAB — CBC
HEMATOCRIT: 41.4 % (ref 40.0–52.0)
HEMOGLOBIN: 14.7 g/dL (ref 13.0–18.0)
MCH: 33.4 pg (ref 26.0–34.0)
MCHC: 35.5 g/dL (ref 32.0–36.0)
MCV: 93.9 fL (ref 80.0–100.0)
Platelets: 179 10*3/uL (ref 150–440)
RBC: 4.41 MIL/uL (ref 4.40–5.90)
RDW: 14.3 % (ref 11.5–14.5)
WBC: 9.4 10*3/uL (ref 3.8–10.6)
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
## 2017-07-20 MED ORDER — MORPHINE SULFATE (PF) 4 MG/ML IV SOLN
4.0000 mg | Freq: Once | INTRAVENOUS | Status: AC
  Administered 2017-07-20: 4 mg via INTRAVENOUS
  Filled 2017-07-20: qty 1
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
## 2017-07-21 DIAGNOSIS — S62514A Nondisplaced fracture of proximal phalanx of right thumb, initial encounter for closed fracture: Secondary | ICD-10-CM | POA: Diagnosis not present

## 2017-07-21 LAB — TROPONIN I: TROPONIN I: 0.04 ng/mL — AB (ref <0.03)

## 2017-07-21 MED ORDER — IBUPROFEN 800 MG PO TABS: 800.0000 mg | ORAL_TABLET | Freq: Three times a day (TID) | ORAL | 0 refills | Status: DC | PRN

## 2017-07-21 NOTE — Discharge Instructions (Signed)
Please follow up with orthopedics for further evaluation

## 2017-07-21 NOTE — ED Provider Notes (Signed)
-----------------------------------------   1:22 AM on 07/21/2017 -----------------------------------------   Blood pressure (!) 162/98, pulse 79, temperature 97.8 F (36.6 C), temperature source Oral, resp. rate 16, height 5\' 8"  (1.727 m), weight 99.3 kg (219 lb), SpO2 98 %.  Assuming care from Dr. Archie Balboa.  In short, Charles Wells is a 61 y.o. 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  Refer to the original H&P for additional details.  The current plan of care is to follow up the results of the repeat troponin.  The patient's repeat troponin is unremarkable. I will discharge the patient home to follow-up with his primary care physician.        Loney Hering, MD 07/21/17 [phone]
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
## 2017-08-03 DIAGNOSIS — S20219A Contusion of unspecified front wall of thorax, initial encounter: Secondary | ICD-10-CM | POA: Diagnosis not present

## 2017-08-03 DIAGNOSIS — S6981XA Other specified injuries of right wrist, hand and finger(s), initial encounter: Secondary | ICD-10-CM | POA: Diagnosis not present

## 2017-08-07 ENCOUNTER — Other Ambulatory Visit: Payer: Self-pay | Admitting: Orthopedic Surgery

## 2017-08-07 DIAGNOSIS — S20219A Contusion of unspecified front wall of thorax, initial encounter: Secondary | ICD-10-CM

## 2017-08-13 ENCOUNTER — Ambulatory Visit
Admission: RE | Admit: 2017-08-13 | Discharge: 2017-08-13 | Disposition: A | Discharge status: Home / Self Care | Payer: Self-pay | Source: Ambulatory Visit | Attending: Orthopedic Surgery | Admitting: Orthopedic Surgery

## 2017-08-13 DIAGNOSIS — J309 Allergic rhinitis, unspecified: Secondary | ICD-10-CM | POA: Diagnosis not present

## 2017-08-13 DIAGNOSIS — S2220XA Unspecified fracture of sternum, initial encounter for closed fracture: Secondary | ICD-10-CM | POA: Diagnosis not present

## 2017-08-13 DIAGNOSIS — S20219A Contusion of unspecified front wall of thorax, initial encounter: Secondary | ICD-10-CM

## 2017-08-13 DIAGNOSIS — J4530 Mild persistent asthma, uncomplicated: Secondary | ICD-10-CM | POA: Diagnosis not present

## 2017-08-13 DIAGNOSIS — R5383 Other fatigue: Secondary | ICD-10-CM | POA: Diagnosis not present

## 2017-08-13 DIAGNOSIS — G4733 Obstructive sleep apnea (adult) (pediatric): Secondary | ICD-10-CM | POA: Diagnosis not present
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
## 2017-08-14 DIAGNOSIS — G4733 Obstructive sleep apnea (adult) (pediatric): Secondary | ICD-10-CM | POA: Diagnosis not present

## 2017-08-19 DIAGNOSIS — R5383 Other fatigue: Secondary | ICD-10-CM | POA: Diagnosis not present

## 2017-08-23 DIAGNOSIS — S20219D Contusion of unspecified front wall of thorax, subsequent encounter: Secondary | ICD-10-CM | POA: Diagnosis not present

## 2017-09-09 DIAGNOSIS — Z79899 Other long term (current) drug therapy: Secondary | ICD-10-CM | POA: Diagnosis not present

## 2017-09-09 DIAGNOSIS — G894 Chronic pain syndrome: Secondary | ICD-10-CM | POA: Diagnosis not present

## 2017-09-25 DIAGNOSIS — J309 Allergic rhinitis, unspecified: Secondary | ICD-10-CM | POA: Diagnosis not present

## 2017-09-25 DIAGNOSIS — J4540 Moderate persistent asthma, uncomplicated: Secondary | ICD-10-CM | POA: Diagnosis not present

## 2017-09-25 DIAGNOSIS — R5383 Other fatigue: Secondary | ICD-10-CM | POA: Diagnosis not present

## 2017-09-25 DIAGNOSIS — J069 Acute upper respiratory infection, unspecified: Secondary | ICD-10-CM | POA: Diagnosis not present

## 2017-09-25 DIAGNOSIS — G4733 Obstructive sleep apnea (adult) (pediatric): Secondary | ICD-10-CM | POA: Diagnosis not present

## 2017-10-07 DIAGNOSIS — C61 Malignant neoplasm of prostate: Secondary | ICD-10-CM | POA: Diagnosis not present

## 2017-10-07 DIAGNOSIS — G4733 Obstructive sleep apnea (adult) (pediatric): Secondary | ICD-10-CM | POA: Diagnosis not present

## 2017-11-01 DIAGNOSIS — B028 Zoster with other complications: Secondary | ICD-10-CM | POA: Diagnosis not present

## 2017-11-01 DIAGNOSIS — M545 Low back pain: Secondary | ICD-10-CM | POA: Diagnosis not present

## 2017-11-01 DIAGNOSIS — L308 Other specified dermatitis: Secondary | ICD-10-CM | POA: Diagnosis not present

## 2017-11-01 DIAGNOSIS — Z79899 Other long term (current) drug therapy: Secondary | ICD-10-CM | POA: Diagnosis not present

## 2017-11-01 DIAGNOSIS — S335XXA Sprain of ligaments of lumbar spine, initial encounter: Secondary | ICD-10-CM | POA: Diagnosis not present

## 2017-11-15 ENCOUNTER — Ambulatory Visit (INDEPENDENT_AMBULATORY_CARE_PROVIDER_SITE_OTHER): Payer: PPO | Admitting: Podiatry

## 2017-11-15 ENCOUNTER — Encounter: Payer: Self-pay | Admitting: Podiatry

## 2017-11-15 DIAGNOSIS — M722 Plantar fascial fibromatosis: Secondary | ICD-10-CM

## 2017-11-15 DIAGNOSIS — L84 Corns and callosities: Secondary | ICD-10-CM
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
## 2017-11-25 DIAGNOSIS — R350 Frequency of micturition: Secondary | ICD-10-CM | POA: Diagnosis not present

## 2017-11-25 DIAGNOSIS — N401 Enlarged prostate with lower urinary tract symptoms: Secondary | ICD-10-CM | POA: Diagnosis not present

## 2017-11-25 DIAGNOSIS — R972 Elevated prostate specific antigen [PSA]: Secondary | ICD-10-CM | POA: Diagnosis not present

## 2017-11-25 DIAGNOSIS — C61 Malignant neoplasm of prostate: Secondary | ICD-10-CM | POA: Diagnosis not present

## 2017-11-25 DIAGNOSIS — N5201 Erectile dysfunction due to arterial insufficiency: Secondary | ICD-10-CM | POA: Diagnosis not present

## 2017-11-26 ENCOUNTER — Other Ambulatory Visit: Payer: Self-pay | Admitting: Urology

## 2017-11-26 DIAGNOSIS — C61 Malignant neoplasm of prostate: Secondary | ICD-10-CM

## 2017-12-09 ENCOUNTER — Ambulatory Visit: Payer: PPO | Admitting: Podiatry

## 2017-12-10 ENCOUNTER — Ambulatory Visit
Admission: RE | Admit: 2017-12-10 | Discharge: 2017-12-10 | Disposition: A | Discharge status: Home / Self Care | Payer: PPO | Source: Ambulatory Visit | Attending: Urology | Admitting: Urology

## 2017-12-10 DIAGNOSIS — C61 Malignant neoplasm of prostate: Secondary | ICD-10-CM

## 2017-12-10 IMAGING — MR MR PROSTATE WO/W CM
56 series · 56 of 56 positions shown · IV contrast (Multihance 20ml)
Comparison: None.

CLINICAL DATA: Thoughts

EXAM:
MR PROSTATE WITHOUT AND WITH CONTRAST
TECHNIQUE: Multiplanar multisequence MRI images were obtained of the pelvis
centered about the prostate. Pre and post contrast images were
obtained.
CONTRAST:  20mL MULTIHANCE GADOBENATE DIMEGLUMINE 529 MG/ML IV SOLN

[Series 3: T1 · axial · 8.0mm · 1.06mm/px · 1 of 28 slices shown (1 of 2)]
[im 1/28]
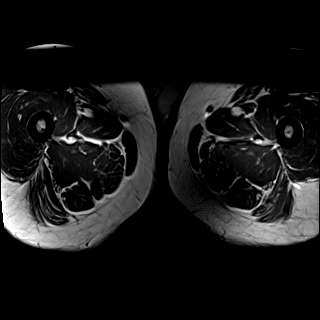

[Series 4: bSSFP fat-sat · axial · 8.0mm · 0.74mm/px · 1 of 28 slices shown]
[im 1/28]
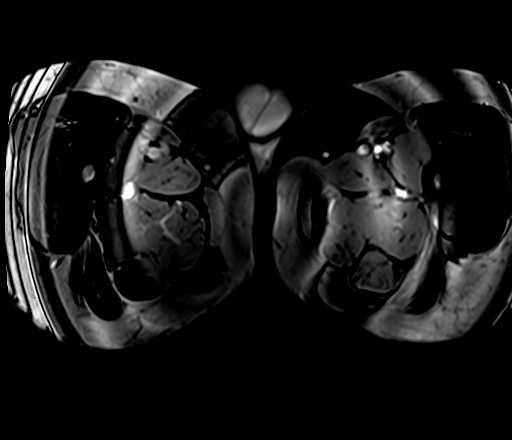

[Series 6: T2 · sagittal · 3.5mm · 0.56mm/px · 1 of 39 slices shown (1 of 4)]
[im 1/39]
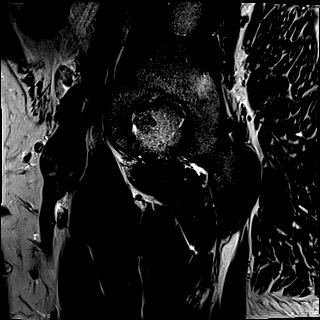

[Series 7: T1 · axial · 3.0mm · 0.31mm/px · 1 of 24 slices shown (2 of 2)]
[im 1/24]
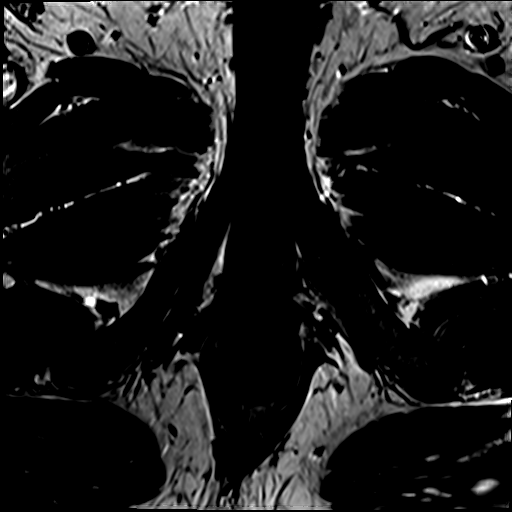

[Series 8: T2 · axial · 3.5mm · 0.56mm/px · 1 of 23 slices shown (2 of 4)]
[im 1/23]
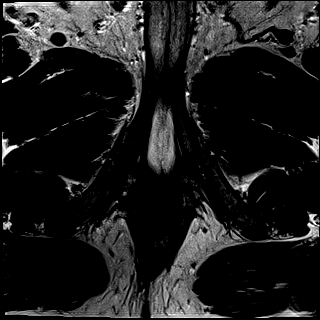

[Series 9: T2 · axial · 1.0mm · 1.04mm/px · 1 of 80 slices shown (3 of 4)]
[im 1/80]
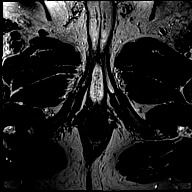

[Series 10: T2 · coronal · 3.5mm · 0.56mm/px · 1 of 23 slices shown (4 of 4)]
[im 1/23]
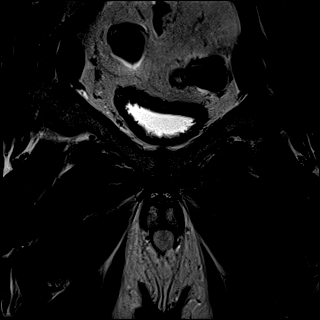

[Series 11: DWI · axial · 3.5mm · 1.56mm/px · 1 of 59 slices shown (1 of 2)]
[im 1/59]
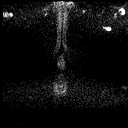

[Series 12: DWI · axial · 3.5mm · 1.56mm/px · 1 of 20 slices shown (2 of 2)]
[im 1/20]
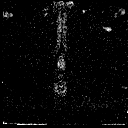

[Series 13: pre t1_twist_tra_dyn_ttc=5.3s · axial · non-contrast · 3.5mm · 0.83mm/px · 1 of 20 slices shown]
[im 1/20]
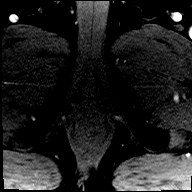

[Series 14: post t1_twist_tra_dyn-copy center · axial · 3.5mm · 0.83mm/px · 1 of 20 slices shown (1 of 24)]
[im 1/20]
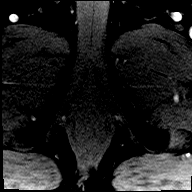

[Series 15: post t1_twist_tra_dyn-copy center · axial · 3.5mm · 0.83mm/px · 1 of 20 slices shown (2 of 24)]
[im 1/20]
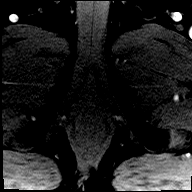

[Series 16: post t1_twist_tra_dyn-copy cent_sub_ttc=23.0s · axial · 3.5mm · 0.83mm/px · 1 of 18 slices shown]
[im 1/18]
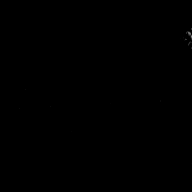

[Series 17: post t1_twist_tra_dyn-copy center · axial · 3.5mm · 0.83mm/px · 1 of 20 slices shown (3 of 24)]
[im 1/20]
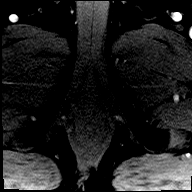

[Series 18: post t1_twist_tra_dyn-copy cent_sub_ttc=33.2s · axial · 3.5mm · 0.83mm/px · 1 of 15 slices shown]
[im 1/15]
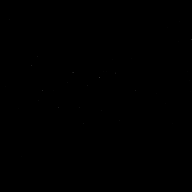

[Series 19: post t1_twist_tra_dyn-copy center · axial · 3.5mm · 0.83mm/px · 1 of 20 slices shown (4 of 24)]
[im 1/20]
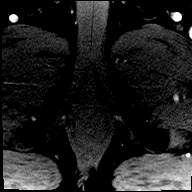

[Series 20: post t1_twist_tra_dyn-copy cent_sub_ttc=43.4s · axial · 3.5mm · 0.83mm/px · 1 of 20 slices shown]
[im 1/20]
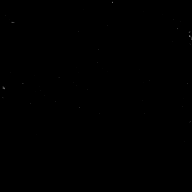

[Series 21: post t1_twist_tra_dyn-copy center · axial · 3.5mm · 0.83mm/px · 1 of 20 slices shown (5 of 24)]
[im 1/20]
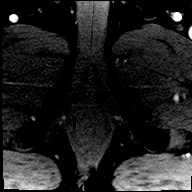

[Series 22: post t1_twist_tra_dyn-copy cent_sub_ttc=53.6s · axial · 3.5mm · 0.83mm/px · 1 of 20 slices shown]
[im 1/20]
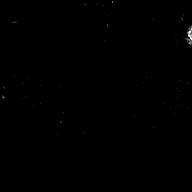

[Series 23: post t1_twist_tra_dyn-copy center · axial · 3.5mm · 0.83mm/px · 1 of 20 slices shown (6 of 24)]
[im 1/20]
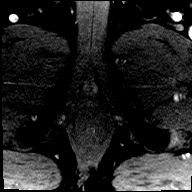

[Series 24: post t1_twist_tra_dyn-copy cent_sub_ttc=63.8s · axial · 3.5mm · 0.83mm/px · 1 of 20 slices shown]
[im 1/20]
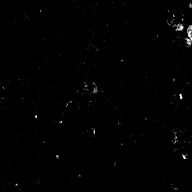

[Series 25: post t1_twist_tra_dyn-copy center · axial · 3.5mm · 0.83mm/px · 1 of 20 slices shown (7 of 24)]
[im 1/20]
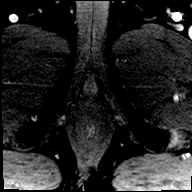

[Series 26: post t1_twist_tra_dyn-copy cent_sub_ttc=74.0s · axial · 3.5mm · 0.83mm/px · 1 of 20 slices shown]
[im 1/20]
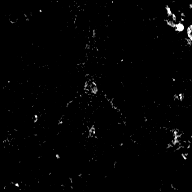

[Series 27: post t1_twist_tra_dyn-copy center · axial · 3.5mm · 0.83mm/px · 1 of 20 slices shown (8 of 24)]
[im 1/20]
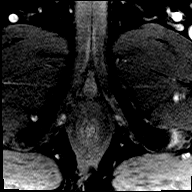

[Series 28: post t1_twist_tra_dyn-copy cent_sub_ttc=84.2s · axial · 3.5mm · 0.83mm/px · 1 of 19 slices shown]
[im 1/19]
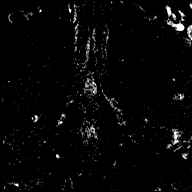

[Series 29: post t1_twist_tra_dyn-copy center · axial · 3.5mm · 0.83mm/px · 1 of 20 slices shown (9 of 24)]
[im 1/20]
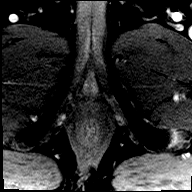

[Series 30: post t1_twist_tra_dyn-copy cent_sub_ttc=94.4s · axial · 3.5mm · 0.83mm/px · 1 of 20 slices shown]
[im 1/20]
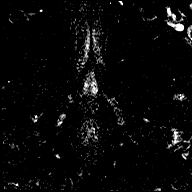

[Series 31: post t1_twist_tra_dyn-copy center · axial · 3.5mm · 0.83mm/px · 1 of 20 slices shown (10 of 24)]
[im 1/20]
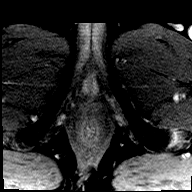

[Series 32: post t1_twist_tra_dyn-copy cent_sub_ttc=104.7s · axial · 3.5mm · 0.83mm/px · 1 of 20 slices shown]
[im 1/20]
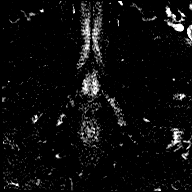

[Series 33: post t1_twist_tra_dyn-copy center · axial · 3.5mm · 0.83mm/px · 1 of 20 slices shown (11 of 24)]
[im 1/20]
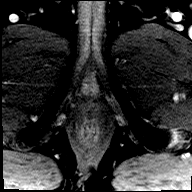

[Series 34: post t1_twist_tra_dyn-copy cent_sub_ttc=114.9s · axial · 3.5mm · 0.83mm/px · 1 of 20 slices shown]
[im 1/20]
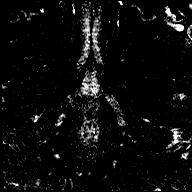

[Series 35: post t1_twist_tra_dyn-copy center · axial · 3.5mm · 0.83mm/px · 1 of 20 slices shown (12 of 24)]
[im 1/20]
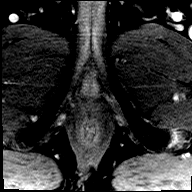

[Series 36: post t1_twist_tra_dyn-copy cent_sub_ttc=125.1s · axial · 3.5mm · 0.83mm/px · 1 of 20 slices shown]
[im 1/20]
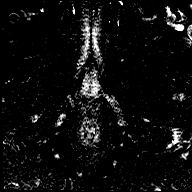

[Series 37: post t1_twist_tra_dyn-copy center · axial · 3.5mm · 0.83mm/px · 1 of 20 slices shown (13 of 24)]
[im 1/20]
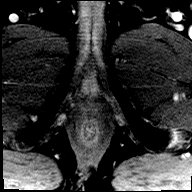

[Series 38: post t1_twist_tra_dyn-copy cent_sub_ttc=135.3s · axial · 3.5mm · 0.83mm/px · 1 of 20 slices shown]
[im 1/20]
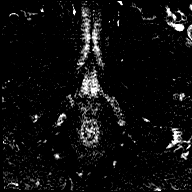

[Series 39: post t1_twist_tra_dyn-copy center · axial · 3.5mm · 0.83mm/px · 1 of 20 slices shown (14 of 24)]
[im 1/20]
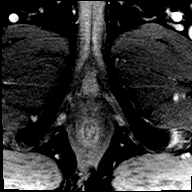

[Series 40: post t1_twist_tra_dyn-copy cent_sub_ttc=145.5s · axial · 3.5mm · 0.83mm/px · 1 of 20 slices shown]
[im 1/20]
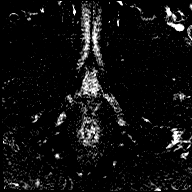

[Series 41: post t1_twist_tra_dyn-copy center · axial · 3.5mm · 0.83mm/px · 1 of 20 slices shown (15 of 24)]
[im 1/20]
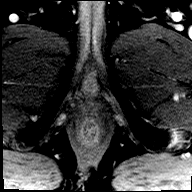

[Series 42: post t1_twist_tra_dyn-copy cent_sub_ttc=155.7s · axial · 3.5mm · 0.83mm/px · 1 of 20 slices shown]
[im 1/20]
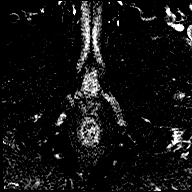

[Series 43: post t1_twist_tra_dyn-copy center · axial · 3.5mm · 0.83mm/px · 1 of 20 slices shown (16 of 24)]
[im 1/20]
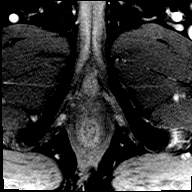

[Series 44: post t1_twist_tra_dyn-copy cent_sub_ttc=165.9s · axial · 3.5mm · 0.83mm/px · 1 of 20 slices shown]
[im 1/20]
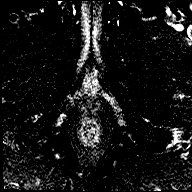

[Series 45: post t1_twist_tra_dyn-copy center · axial · 3.5mm · 0.83mm/px · 1 of 20 slices shown (17 of 24)]
[im 1/20]
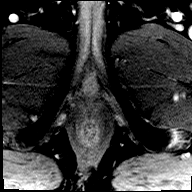

[Series 46: post t1_twist_tra_dyn-copy cent_sub_ttc=176.1s · axial · 3.5mm · 0.83mm/px · 1 of 20 slices shown]
[im 1/20]
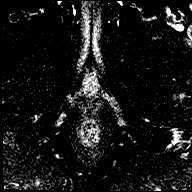

[Series 47: post t1_twist_tra_dyn-copy center · axial · 3.5mm · 0.83mm/px · 1 of 20 slices shown (18 of 24)]
[im 1/20]
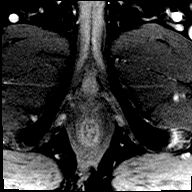

[Series 48: post t1_twist_tra_dyn-copy cent_sub_ttc=186.3s · axial · 3.5mm · 0.83mm/px · 1 of 20 slices shown]
[im 1/20]
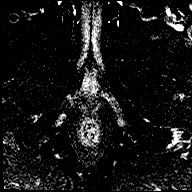

[Series 49: post t1_twist_tra_dyn-copy center · axial · 3.5mm · 0.83mm/px · 1 of 20 slices shown (19 of 24)]
[im 1/20]
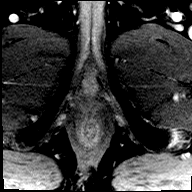

[Series 50: post t1_twist_tra_dyn-copy cent_sub_ttc=196.5s · axial · 3.5mm · 0.83mm/px · 1 of 20 slices shown]
[im 1/20]
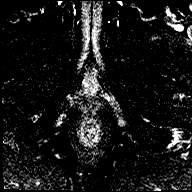

[Series 51: post t1_twist_tra_dyn-copy center · axial · 3.5mm · 0.83mm/px · 1 of 20 slices shown (20 of 24)]
[im 1/20]
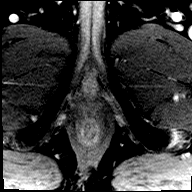

[Series 52: post t1_twist_tra_dyn-copy cent_sub_ttc=206.7s · axial · 3.5mm · 0.83mm/px · 1 of 20 slices shown]
[im 1/20]
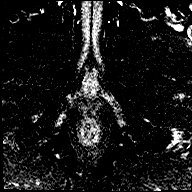

[Series 53: post t1_twist_tra_dyn-copy center · axial · 3.5mm · 0.83mm/px · 1 of 20 slices shown (21 of 24)]
[im 1/20]
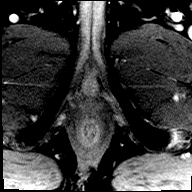

[Series 54: post t1_twist_tra_dyn-copy cent_sub_ttc=216.9s · axial · 3.5mm · 0.83mm/px · 1 of 20 slices shown]
[im 1/20]
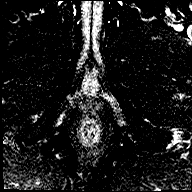

[Series 55: post t1_twist_tra_dyn-copy center · axial · 3.5mm · 0.83mm/px · 1 of 20 slices shown (22 of 24)]
[im 1/20]
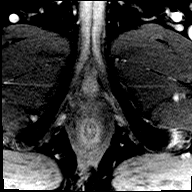

[Series 56: post t1_twist_tra_dyn-copy cent_sub_ttc=227.1s · axial · 3.5mm · 0.83mm/px · 1 of 20 slices shown]
[im 1/20]
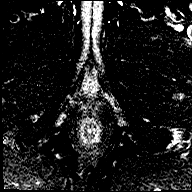

[Series 57: post t1_twist_tra_dyn-copy center · axial · 3.5mm · 0.83mm/px · 1 of 20 slices shown (23 of 24)]
[im 1/20]
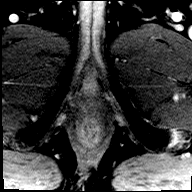

[Series 58: post t1_twist_tra_dyn-copy cent_sub_ttc=237.3s · axial · 3.5mm · 0.83mm/px · 1 of 20 slices shown]
[im 1/20]
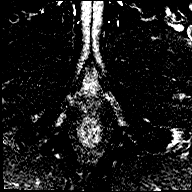

[Series 59: post t1_twist_tra_dyn-copy center · axial · 3.5mm · 0.83mm/px · 1 of 20 slices shown (24 of 24)]
[im 1/20]
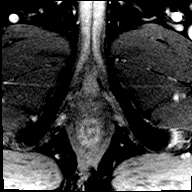

[56 of 56 positions shown; findings below may reference images not displayed]

FINDINGS: Prostate: 9 x 4 x 9 mm focal low T2 lesion in the posteromedial left
mid gland of the peripheral zone (series 8/image 12). Associated
early arterial enhancement (series 31/image 11). Equivocal
ADC/restricted diffusion (series 12/image 11). PI-RADS 3.

Central gland is within normal limits. No suspicious central gland
nodule on T2.

Volume: 4.0 x 3.5 x 2.7 cm (calculated volume 18.3 mL)

Transcapsular spread:  Absent.

Seminal vesicle involvement: Absent.

Neurovascular bundle involvement: Absent.

Pelvic adenopathy: Absent.

Bone metastasis: Absent.

Other findings: Node.
IMPRESSION: 9 mm lesion in the posteromedial left mid gland of the peripheral
zone, worrisome for at least low grade macroscopic prostate cancer.
PI-RADS 3.

No evidence of extracapsular extension, seminal vesicle invasion, or
metastatic disease.

## 2017-12-10 MED ORDER — GADOBENATE DIMEGLUMINE 529 MG/ML IV SOLN
20.0000 mL | Freq: Once | INTRAVENOUS | Status: AC | PRN
  Administered 2017-12-10: 20 mL via INTRAVENOUS

## 2018-01-16 DIAGNOSIS — C61 Malignant neoplasm of prostate: Secondary | ICD-10-CM | POA: Diagnosis not present

## 2018-01-16 HISTORY — PX: PROSTATE BIOPSY: SHX241

## 2018-01-20 DIAGNOSIS — I10 Essential (primary) hypertension: Secondary | ICD-10-CM | POA: Diagnosis not present

## 2018-01-20 DIAGNOSIS — G8918 Other acute postprocedural pain: Secondary | ICD-10-CM | POA: Diagnosis not present

## 2018-01-20 DIAGNOSIS — R5381 Other malaise: Secondary | ICD-10-CM | POA: Diagnosis not present

## 2018-01-20 DIAGNOSIS — F419 Anxiety disorder, unspecified: Secondary | ICD-10-CM | POA: Diagnosis not present

## 2018-01-20 DIAGNOSIS — C61 Malignant neoplasm of prostate: Secondary | ICD-10-CM | POA: Diagnosis not present

## 2018-02-05 DIAGNOSIS — C61 Malignant neoplasm of prostate: Secondary | ICD-10-CM | POA: Diagnosis not present

## 2018-02-07 ENCOUNTER — Encounter: Payer: Self-pay | Admitting: Radiation Oncology

## 2018-02-22 DIAGNOSIS — R0602 Shortness of breath: Secondary | ICD-10-CM | POA: Diagnosis not present

## 2018-02-22 DIAGNOSIS — J018 Other acute sinusitis: Secondary | ICD-10-CM | POA: Diagnosis not present

## 2018-02-22 DIAGNOSIS — J029 Acute pharyngitis, unspecified: Secondary | ICD-10-CM | POA: Diagnosis not present

## 2018-02-22 DIAGNOSIS — Z87891 Personal history of nicotine dependence: Secondary | ICD-10-CM | POA: Diagnosis not present

## 2018-02-28 DIAGNOSIS — R0989 Other specified symptoms and signs involving the circulatory and respiratory systems: Secondary | ICD-10-CM | POA: Diagnosis not present

## 2018-02-28 DIAGNOSIS — J018 Other acute sinusitis: Secondary | ICD-10-CM | POA: Diagnosis not present

## 2018-02-28 DIAGNOSIS — G894 Chronic pain syndrome: Secondary | ICD-10-CM | POA: Diagnosis not present

## 2018-02-28 DIAGNOSIS — Z79899 Other long term (current) drug therapy: Secondary | ICD-10-CM | POA: Diagnosis not present

## 2018-02-28 DIAGNOSIS — Z0000 Encounter for general adult medical examination without abnormal findings: Secondary | ICD-10-CM | POA: Diagnosis not present

## 2018-03-04 ENCOUNTER — Ambulatory Visit
Admission: RE | Admit: 2018-03-04 | Discharge: 2018-03-04 | Disposition: A | Discharge status: Home / Self Care | Payer: PPO | Source: Ambulatory Visit | Attending: Radiation Oncology | Admitting: Radiation Oncology

## 2018-03-04 ENCOUNTER — Encounter: Payer: Self-pay | Admitting: Radiation Oncology

## 2018-03-04 ENCOUNTER — Encounter: Payer: Self-pay | Admitting: Medical Oncology

## 2018-03-04 ENCOUNTER — Other Ambulatory Visit: Payer: Self-pay

## 2018-03-04 VITALS — BP 122/74 | HR 94 | Temp 97.9°F | Resp 18 | Wt 236.6 lb

## 2018-03-04 DIAGNOSIS — R972 Elevated prostate specific antigen [PSA]: Secondary | ICD-10-CM | POA: Diagnosis not present

## 2018-03-04 DIAGNOSIS — C61 Malignant neoplasm of prostate: Secondary | ICD-10-CM | POA: Diagnosis not present

## 2018-03-04 DIAGNOSIS — Z801 Family history of malignant neoplasm of trachea, bronchus and lung: Secondary | ICD-10-CM | POA: Insufficient documentation

## 2018-03-04 DIAGNOSIS — I10 Essential (primary) hypertension: Secondary | ICD-10-CM | POA: Diagnosis not present

## 2018-03-04 DIAGNOSIS — F418 Other specified anxiety disorders: Secondary | ICD-10-CM | POA: Insufficient documentation

## 2018-03-04 DIAGNOSIS — J449 Chronic obstructive pulmonary disease, unspecified: Secondary | ICD-10-CM | POA: Insufficient documentation

## 2018-03-04 DIAGNOSIS — M5136 Other intervertebral disc degeneration, lumbar region: Secondary | ICD-10-CM | POA: Insufficient documentation

## 2018-03-04 DIAGNOSIS — N400 Enlarged prostate without lower urinary tract symptoms: Secondary | ICD-10-CM | POA: Diagnosis not present

## 2018-03-04 DIAGNOSIS — E119 Type 2 diabetes mellitus without complications: Secondary | ICD-10-CM | POA: Diagnosis not present

## 2018-03-04 DIAGNOSIS — Z85828 Personal history of other malignant neoplasm of skin: Secondary | ICD-10-CM | POA: Insufficient documentation

## 2018-03-04 DIAGNOSIS — Z79899 Other long term (current) drug therapy: Secondary | ICD-10-CM | POA: Insufficient documentation

## 2018-03-04 DIAGNOSIS — M5030 Other cervical disc degeneration, unspecified cervical region: Secondary | ICD-10-CM | POA: Diagnosis not present

## 2018-03-04 DIAGNOSIS — Z87891 Personal history of nicotine dependence: Secondary | ICD-10-CM | POA: Insufficient documentation

## 2018-03-04 DIAGNOSIS — K219 Gastro-esophageal reflux disease without esophagitis: Secondary | ICD-10-CM | POA: Insufficient documentation

## 2018-03-04 DIAGNOSIS — Z808 Family history of malignant neoplasm of other organs or systems: Secondary | ICD-10-CM | POA: Diagnosis not present

## 2018-03-04 DIAGNOSIS — G4730 Sleep apnea, unspecified: Secondary | ICD-10-CM | POA: Diagnosis not present

## 2018-03-04 DIAGNOSIS — E7800 Pure hypercholesterolemia, unspecified: Secondary | ICD-10-CM | POA: Diagnosis not present

## 2018-03-04 NOTE — Progress Notes (Signed)
Radiation Oncology         [phone] ________________________________  Initial outpatient Consultation  Name: Charles Wells MRN: [id number]  Date: 03/04/2018  DOB: 1956-04-16  DP:OEUMP, Otelia Limes, PA-C  Irine Seal, MD   REFERRING PHYSICIAN: Irine Seal, MD  DIAGNOSIS: 62 y.o. 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 Malignant neoplasm of prostate ([place]) [place] [facility] referral to Social Work    HISTORY OF PRESENT ILLNESS: Charles Wells is a 62 y.o. male with a diagnosis of prostate cancer. He was noted to have an elevated PSA in 2014 which prompted a biopsy and per report a diagnosis of prostate cancer. He was originally diagnosed in [place] and was offered surgical resection but declined at that time. He sought a second opinion and his PSA has been followed since. He had a PSA rise to 5.86 on 10/07/17 and with Dr. Jeffie Pollock underwent a MRI on 12/10/17 that revealed a 9 mm area of enhancement in the left prostate and the gland was about 18 cc. He had digital rectal examination was performed at that time revealing no nodules.  The patient proceeded to transrectal ultrasound with 12 biopsies of the prostate on 02/11/18.   Out of 12 core biopsies,4 were positive.  The maximum Gleason score was 4+3, and this was seen in left mid, lateral, and base of the gland, the other was 3+3.  The patient reviewed the biopsy results with his urologist and he has kindly been referred today for discussion of potential radiation treatment options.   PREVIOUS RADIATION THERAPY: No  PAST MEDICAL HISTORY:  Past Medical History:  Diagnosis Date  . Acid reflux   . Anxiety   . Asthma    daily inhalers  . BPH (benign prostatic hyperplasia)   . Cataract, immature   . COPD (chronic obstructive pulmonary disease) ([place])   . Degenerative disc disease, cervical   . Degenerative disc disease, lumbar   .  Depression   . Deviated nasal septum   . Diabetes mellitus type 2, noninsulin dependent ([place])   . High cholesterol   . Hypertension    states under control with meds., has been on med. x "a while"  . Metatarsal bone fracture 53/6144   post-op complication right foot  . Prostate cancer ([place])   . Sleep apnea    uses BiPAP nightly      PAST SURGICAL HISTORY: Past Surgical History:  Procedure Laterality Date  . BONE RESECTION Right 07/26/2015   2nd/3rd metatarsal with plate fixation  . CERVICAL FUSION  2005  . HARDWARE REMOVAL Right 06/06/2016   Procedure: REMOVAL OF HARDWARE 2ND AND 3RD METATARSALS RIGHT FOOT ;  Surgeon: Trula Slade, DPM;  Location: [facility];  Service: Podiatry;  Laterality: Right;  . ORIF TOE FRACTURE Right 06/06/2016   Procedure: ORIF 2ND AND 3RD METATARSAL, RIGHT FOOT;  Surgeon: Trula Slade, DPM;  Location: [facility];  Service: Podiatry;  Laterality: Right;  . PROSTATE BIOPSY  2014  . PROSTATE BIOPSY  2015  . PROSTATE BIOPSY  01/16/2018  . STERIOD INJECTION Right 06/06/2016   Procedure: STEROID INJECTION RIGHT HEEL;  Surgeon: Trula Slade, DPM;  Location: [facility];  Service: Podiatry;  Laterality: Right;    FAMILY HISTORY:  Family History  Problem Relation Age of Onset  . Depression Mother   . Hypertension Mother   .  Heart disease Mother   . Arthritis Mother   . Stroke Father   . Depression Father   . Hypertension Father   . COPD Father   . Heart disease Father   . Arthritis Father   . Kidney disease Father   . Lung cancer Father   . Skin cancer Father     SOCIAL HISTORY:  Social History   Socioeconomic History  . Marital status: Soil scientist    Spouse name: Pamala Hurry  . Number of children: 1  . Years of education: Not on file  . Highest education level: Not on file  Occupational History    Comment: medically disabled  Social Needs  . Financial resource strain: Not on file    . Food insecurity:    Worry: Not on file    Inability: Not on file  . Transportation needs:    Medical: Not on file    Non-medical: Not on file  Tobacco Use  . Smoking status: Former Smoker    Packs/day: 0.50    Years: 46.00    Pack years: 23.00    Types: Cigarettes    Last attempt to quit: 02/18/2018    Years since quitting: 0.0  . Smokeless tobacco: Never Used  . Tobacco comment: stopped smoking two weeks ago  Substance and Sexual Activity  . Alcohol use: Yes    Alcohol/week: 0.0 oz    Comment: weekends  . Drug use: No  . Sexual activity: Yes    Comment: Suffers from ED. Takes Cialis.  Lifestyle  . Physical activity:    Days per week: Not on file    Minutes per session: Not on file  . Stress: Not on file  Relationships  . Social connections:    Talks on phone: Not on file    Gets together: Not on file    Attends religious service: Not on file    Active member of club or organization: Not on file    Attends meetings of clubs or organizations: Not on file    Relationship status: Not on file  . Intimate partner violence:    Fear of current or ex partner: Not on file    Emotionally abused: Not on file    Physically abused: Not on file    Forced sexual activity: Not on file  Other Topics Concern  . Not on file  Social History Narrative  . 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   ALLERGIES: Shellfish allergy  MEDICATIONS:  Current Outpatient Medications  Medication Sig Dispense Refill  . albuterol (PROVENTIL HFA;VENTOLIN HFA) 108 (90 BASE) MCG/ACT inhaler Inhale 2 puffs into the lungs 4 (four) times daily.     Marland Kitchen ALPRAZolam (XANAX) 0.5 MG tablet Take 0.1 mg by mouth 3 (three) times daily as needed for anxiety.    . budesonide-formoterol (SYMBICORT) 160-4.5 MCG/ACT inhaler Inhale 2 puffs into the lungs 2 (two) times daily.    Marland Kitchen buPROPion (WELLBUTRIN SR) 150 MG 12 hr tablet Take 150 mg by mouth 2 (two) times daily.    . cetirizine  (ZYRTEC) 10 MG tablet Take 10 mg by mouth at bedtime.    . chlorthalidone (HYGROTON) 25 MG tablet Take 25 mg by mouth daily.    . cyclobenzaprine (FLEXERIL) 10 MG tablet Take 10 mg by mouth.    . diclofenac sodium (VOLTAREN) 1 % GEL Apply topically 4 (four) times daily.    . fluticasone (FLONASE) 50 MCG/ACT nasal spray Place  1 spray into both nostrils 2 (two) times daily.     Marland Kitchen gabapentin (NEURONTIN) 300 MG capsule Take 300 mg by mouth at bedtime.    Marland Kitchen HYDROcodone-acetaminophen (NORCO) 10-325 MG tablet Take 1 tablet by mouth every 6 (six) hours as needed. (Patient taking differently: Take 1 tablet by mouth every 4 (four) hours as needed. ) 20 tablet 0  . HYDROcodone-acetaminophen (NORCO/VICODIN) 5-325 MG tablet Take 1 tablet by mouth every 6 (six) hours as needed for moderate pain.    Marland Kitchen ipratropium-albuterol (DUONEB) 0.5-2.5 (3) MG/3ML SOLN Take 3 mLs by nebulization every 6 (six) hours as needed.    Marland Kitchen losartan (COZAAR) 100 MG tablet Take 100 mg by mouth daily.    . magnesium oxide (MAG-OX) 400 MG tablet Take 400 mg by mouth daily.    . metFORMIN (GLUCOPHAGE) 500 MG tablet Take 500 mg by mouth daily with breakfast.    . montelukast (SINGULAIR) 10 MG tablet Take 10 mg by mouth at bedtime.    Marland Kitchen NARCAN 4 MG/0.1ML LIQD nasal spray kit USE AS DIRECTED  0  . nystatin (NYSTATIN) powder Apply topically 4 (four) times daily.    Marland Kitchen omeprazole (PRILOSEC) 20 MG capsule Take 20 mg by mouth daily.    . potassium chloride SA (K-DUR,KLOR-CON) 20 MEQ tablet Take 40 mEq by mouth daily. Monday, Wednesday, Friday    . potassium chloride SA (K-DUR,KLOR-CON) 20 MEQ tablet Take 20 mEq by mouth daily. Tuesday, Thursday, Saturday, Sunday    . pravastatin (PRAVACHOL) 40 MG tablet Take 40 mg by mouth at bedtime.    . tadalafil (CIALIS) 5 MG tablet Take 5 mg by mouth daily.    . tamsulosin (FLOMAX) 0.4 MG CAPS capsule Take 0.4 mg by mouth daily after breakfast.    . Vitamin D, Ergocalciferol, (DRISDOL) 50000 units CAPS  capsule Take 50,000 Units by mouth every 7 (seven) days.    . benzonatate (TESSALON) 200 MG capsule TK 1 C PO TID  0   Current Facility-Administered Medications  Medication Dose Route Frequency Provider Last Rate Last Dose  . triamcinolone acetonide (KENALOG) 10 MG/ML injection 10 mg  10 mg Other Once Trula Slade, DPM        REVIEW OF SYSTEMS:  On review of systems, the patient reports that he is doing well overall. He denies any chest pain, shortness of breath, cough, fevers, chills, night sweats, unintended weight changes. He denies any bowel disturbances, and denies abdominal pain, nausea or vomiting. He denies any new musculoskeletal or joint aches or pains. His IPSS was 5, indicating mild urinary symptoms. He is able to complete sexual activity with most attempts with daily cialis. A complete review of systems is obtained and is otherwise negative.    PHYSICAL EXAM:  Wt Readings from Last 3 Encounters:  03/04/18 236 lb 9.6 oz (107.3 kg)  07/20/17 219 lb (99.3 kg)  06/06/16 255 lb (115.7 kg)   Temp Readings from Last 3 Encounters:  03/04/18 97.9 F (36.6 C)  07/20/17 97.8 F (36.6 C) (Oral)  08/03/16 98.6 F (37 C)   BP Readings from Last 3 Encounters:  03/04/18 122/74  07/21/17 (!) 144/80  09/10/16 132/89   Pulse Readings from Last 3 Encounters:  03/04/18 94  07/21/17 73  09/10/16 94   Pain Assessment Pain Score: 0-No pain(see progress note)/10  In general this is a well appearing caucasian male in no acute distress. He is alert and oriented x4 and appropriate throughout the examination. HEENT reveals that  the patient is normocephalic, atraumatic. EOMs are intact. PERRLA. Skin is intact without any evidence of gross lesions. Cardiopulmonary assessment is negative for acute distress and he exhibits normal effort.    KPS = 100  100 - Normal; no complaints; no evidence of disease. 90   - Able to carry on normal activity; minor signs or symptoms of disease. 80   -  Normal activity with effort; some signs or symptoms of disease. 51   - Cares for self; unable to carry on normal activity or to do active work. 60   - Requires occasional assistance, but is able to care for most of his personal needs. 50   - Requires considerable assistance and frequent medical care. 57   - Disabled; requires special care and assistance. 22   - Severely disabled; hospital admission is indicated although death not imminent. 28   - Very sick; hospital admission necessary; active supportive treatment necessary. 10   - Moribund; fatal processes progressing rapidly. 0     - Dead  Karnofsky DA, Abelmann [place], Craver LS and Burchenal [facility] [phone]) The use of the nitrogen mustards in the palliative treatment of carcinoma: with particular reference to bronchogenic carcinoma Cancer 1 634-56  LABORATORY DATA:  Lab Results  Component Value Date   WBC 9.4 07/20/2017   HGB 14.7 07/20/2017   HCT 41.4 07/20/2017   MCV 93.9 07/20/2017   PLT 179 07/20/2017   Lab Results  Component Value Date   NA 138 07/20/2017   K 2.9 (L) 07/20/2017   CL 98 (L) 07/20/2017   CO2 27 07/20/2017   No results found for: ALT, AST, GGT, ALKPHOS, BILITOT   RADIOGRAPHY: No results found.    IMPRESSION/PLAN: 1. 62 y.o. 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 We discussed the pathology findings and reviews the nature of prostate cancer. He outlines how T staging, Gleason Score, IPSS, and gland size impact treatment recommendations. He discusses the options for prostatectomy or external radiotherapy +/- ADT, and he is not a great candidate for brachytherapy given the small gland size. We discussed the risks, benefits, short, and long term effects of radiotherapy, and the patient is interested in proceeding. We discussed the delivery and logistics of radiotherapy and anticipates a course of 5 1/2 weeks of radiotherapy. He will need  to proceed with fiducial marker placement and SpaceOAR gel with Dr. Jeffie Pollock prior to proceeding with simulation. He is in agreement with this plan and is not interested in ADT after further counseling.   In a visit lasting 60 minutes, greater than 50% of the time was spent face to face discussing his case, and coordinating the patient's care.     Carola Rhine, PAC    Tyler Pita, MD  [facility] Direct Dial: [phone]  Fax: [phone] [email or web address]  Skype  LinkedIn

## 2018-03-04 NOTE — Progress Notes (Signed)
See progress note under physician encounter. 

## 2018-03-04 NOTE — Progress Notes (Signed)
GU Location of Tumor / Histology: prostatic adenocarcinoma  If Prostate Cancer, Gleason Score is (4 + 3) and PSA is (5.86) on 10/07/2017. Prostate volume: 18 grams  Charles Wells was diagnosed with prostate cancer 07/21/2014 per Dr. Ralene Muskrat note. Patient reports he was diagnosed with prostate cancer in 2013 by a doctor in [place]. Patient has been under active surveillance since 2015. He had an MR fusion biopsy on 01/19/2018 which revealed progression.  Biopsies of prostate (if applicable) revealed:    Past/Anticipated interventions by urology, if any: prostate biopsy, prostate biopsy, active surveillance, prostate biopsy, referral to radiation oncology for consideration of radiation therapy since Dr. Jeffie Pollock does not recommend a prostatectomy. Patient reports Dr. Jeffie Pollock discuss androgen deprivation with the patient but did not give him any injections yet.  Past/Anticipated interventions by medical oncology, if any: no  Weight changes, if any: no  Bowel/Bladder complaints, if any: IPSS 5. Denies dysuria. Reports urinary frequency related to "drinking a lot of water." Denies hematuria, urinary leakage or incontinence. Denies any bowel complaints.    Nausea/Vomiting, if any: no  Pain issues, if any:  Reports chronic low back pain. Reports pain in chest related to muscle and cartilage being torn away from his seventh rib. Reports pain in his right foot.   SAFETY ISSUES:  Prior radiation? no  Pacemaker/ICD? no, no  Possible current pregnancy? no  Is the patient on methotrexate? no  Current Complaints / other details:  62 year old male. Ax: shellfish. Stopped smoking two weeks ago. Resides in [place]. Disabled; not working.

## 2018-03-04 NOTE — Progress Notes (Signed)
Introduced myself to Charles Wells his wife Pamala Hurry and son Elonda Husky as the prostate nurse navigator. He states that he is not interested in surgery as treatment. He is here to discuss his radiation treatment options with Dr. Tammi Klippel. I gave them my business card and asked them to call with questions or concerns.

## 2018-03-05 ENCOUNTER — Encounter: Payer: Self-pay | Admitting: General Practice

## 2018-03-05 ENCOUNTER — Ambulatory Visit: Payer: PPO

## 2018-03-05 ENCOUNTER — Ambulatory Visit: Payer: PPO | Admitting: Radiation Oncology

## 2018-03-05 NOTE — Progress Notes (Signed)
[facility] Psychosocial Distress Screening Clinical Social Work  Clinical Social Work was referred by distress screening protocol.  The patient scored a 7 on the Psychosocial Distress Thermometer which indicates moderate distress. Clinical Social Worker Daryll Drown contacted patient by phone to assess for distress and other psychosocial needs. Unable to reach patient by phone, left VM and requested call back if desired.    ONCBCN DISTRESS SCREENING 03/04/2018  Screening Type Initial Screening  Distress experienced in past week (1-10) 7  Emotional problem type Depression;Nervousness/Anxiety  Information Concerns Type Lack of info about diagnosis;Lack of info about treatment;Lack of info about complementary therapy choices  Physical Problem type Pain;Breathing;Changes in urination;Tingling hands/feet;Sexual problems  Physician notified of physical symptoms Yes  Referral to clinical psychology No  Referral to clinical social work No  Referral to dietition No  Referral to financial advocate No  Referral to support programs No  Referral to palliative care No    Clinical Social Worker follow up needed: Yes.    If yes, follow up plan:  Await return call from patient.   Beverely Pace, [place], LCSW Clinical Social Worker Phone:  [phone]

## 2018-03-12 ENCOUNTER — Telehealth: Payer: Self-pay | Admitting: Podiatry

## 2018-03-12 NOTE — Telephone Encounter (Signed)
Pt stated that he is having trouble with calluses on both of his feet. He is wearing his compression socks but its still swelling. Please give him a call and let him know what he can do next

## 2018-03-12 NOTE — Telephone Encounter (Signed)
Pt states the callouses hurt and the place Dr. Jacqualyn Posey cut out on his inserts isn't helping. I told pt I felt he would benefit from an appt to have the orthotic modified, the callouses trimmed and to make sure he didn't have a ulcer beneath the callouses. Pt states he is being treated by Dr. Tammi Klippel for prostate cancer. I told pt to rest as much as possible until seen. Pt states understanding.

## 2018-03-13 ENCOUNTER — Telehealth: Payer: Self-pay | Admitting: *Deleted

## 2018-03-13 NOTE — Telephone Encounter (Signed)
Returned patient's phone call, spoke with patient 

## 2018-03-17 ENCOUNTER — Ambulatory Visit (INDEPENDENT_AMBULATORY_CARE_PROVIDER_SITE_OTHER): Payer: PPO

## 2018-03-17 ENCOUNTER — Encounter: Payer: Self-pay | Admitting: Podiatry

## 2018-03-17 ENCOUNTER — Ambulatory Visit (INDEPENDENT_AMBULATORY_CARE_PROVIDER_SITE_OTHER): Payer: PPO | Admitting: Podiatry

## 2018-03-17 DIAGNOSIS — M779 Enthesopathy, unspecified: Secondary | ICD-10-CM

## 2018-03-17 DIAGNOSIS — E1149 Type 2 diabetes mellitus with other diabetic neurological complication: Secondary | ICD-10-CM | POA: Diagnosis not present

## 2018-03-17 DIAGNOSIS — M778 Other enthesopathies, not elsewhere classified: Secondary | ICD-10-CM

## 2018-03-17 DIAGNOSIS — Z8781 Personal history of (healed) traumatic fracture: Secondary | ICD-10-CM

## 2018-03-17 DIAGNOSIS — Q828 Other specified congenital malformations of skin: Secondary | ICD-10-CM

## 2018-03-18 NOTE — Progress Notes (Signed)
Subjective: 62 year old male presents the office today primarily for concerns of pain to the forefoot on painful calluses to both of his feet.  He states that also he has noticed some increase in numbness and sharp pains, both of his legs.  He is taking gabapentin 300 mg at nighttime he has not had any side effects this medication.  He states the right foot is been doing well and has had no significant problems with the right foot on the surgery.  Denies any increase in swelling or redness to his feet.  No recent injury. Denies any systemic complaints such as fevers, chills, nausea, vomiting. No acute changes since last appointment, and no other complaints at this time.   He will be starting radiation for prostate cancer later this month.  Objective: AAO x3, NAD DP/PT pulses palpable bilaterally, CRT less than 3 seconds On the right foot scar is well-healed.  There is no tenderness palpation of the surgical site.  There is tenderness on hyperkeratotic lesions bilaterally mostly to the bilateral medial hallux as well as right submetatarsal 5.  Upon treatment there is no underlying ulceration, drainage or any signs of infection.  Subjectively he is getting some increasing nerve pain to both of his legs.  He states that it is symmetrical and worse towards the end of the day.  No significant discomfort on the plantar fascia or insertion today.  No open lesions or pre-ulcerative lesions.  No pain with calf compression, swelling, warmth, erythema  Assessment: Symptomatic hyperkeratotic lesions, neuropathy  Plan: -All treatment options discussed with the patient including all alternatives, risks, complications.  X-rays have been reviewed.  Hardware intact to the right foot without any evidence of acute fracture identified bilaterally.- -I debrided the hyperkeratotic lesions x3 without any complications or bleeding. -Discussed increasing gabapentin to twice a day but monitoring side effects. -Daily foot  inspection -Patient encouraged to call the office with any questions, concerns, change in symptoms.   Charles Wells DPM

## 2018-04-09 ENCOUNTER — Other Ambulatory Visit: Payer: Self-pay | Admitting: Urology

## 2018-04-09 DIAGNOSIS — C61 Malignant neoplasm of prostate: Secondary | ICD-10-CM

## 2018-04-21 DIAGNOSIS — C61 Malignant neoplasm of prostate: Secondary | ICD-10-CM | POA: Diagnosis not present

## 2018-04-22 ENCOUNTER — Telehealth: Payer: Self-pay | Admitting: *Deleted

## 2018-04-22 NOTE — Telephone Encounter (Signed)
CALLED PATIENT TO INFORM OF SIM AND MRI FOR 04-25-18, LVM FOR A RETURN CALL

## 2018-04-24 ENCOUNTER — Telehealth: Payer: Self-pay | Admitting: *Deleted

## 2018-04-24 NOTE — Telephone Encounter (Signed)
CALLED PATIENT TO REMIND OF APPTS. FOR 04-25-18, LVM FOR A RETURN CALL 

## 2018-04-25 ENCOUNTER — Ambulatory Visit (HOSPITAL_COMMUNITY): Admission: RE | Admit: 2018-04-25 | Payer: PPO | Source: Ambulatory Visit

## 2018-04-25 ENCOUNTER — Ambulatory Visit
Admission: RE | Admit: 2018-04-25 | Discharge: 2018-04-25 | Disposition: A | Discharge status: Home / Self Care | Payer: PPO | Source: Ambulatory Visit | Attending: Radiation Oncology | Admitting: Radiation Oncology

## 2018-04-25 DIAGNOSIS — Z510 Encounter for antineoplastic radiation therapy: Secondary | ICD-10-CM | POA: Insufficient documentation

## 2018-04-25 DIAGNOSIS — C61 Malignant neoplasm of prostate: Secondary | ICD-10-CM

## 2018-04-28 ENCOUNTER — Telehealth: Payer: Self-pay | Admitting: *Deleted

## 2018-04-28 NOTE — Telephone Encounter (Signed)
Called patient to inform of sim appt. for 05-02-18 - arrival time - 9:15 am @ [facility] and his MRI for 05-05-18 - arrival time - 3:30 pm @ [facility], no restrictions to test, spoke with patient and he is aware of these appts.

## 2018-05-01 NOTE — Progress Notes (Signed)
  Radiation Oncology         [phone] ________________________________  Name: Charles Wells MRN: [id number]  Date: 05/02/2018  DOB: 10-Aug-1956  SIMULATION AND TREATMENT PLANNING NOTE    ICD-10-CM   1. Malignant neoplasm of prostate ([place]) C61     DIAGNOSIS:  62 y.o. gentleman with unfavorable intermediate risk, stage T1c adenocarcinoma of the prostate with a Gleason score of 4+3, and a PSA of 5.86.  NARRATIVE:  The patient was brought to the [place].  Identity was confirmed.  All relevant records and images related to the planned course of therapy were reviewed.  The patient freely provided informed written consent to proceed with treatment after reviewing the details related to the planned course of therapy. The consent form was witnessed and verified by the simulation staff.  Then, the patient was set-up in a stable reproducible supine position for radiation therapy.  A vacuum lock pillow device was custom fabricated to position his legs in a reproducible immobilized position.  Then, I performed a urethrogram under sterile conditions to identify the prostatic apex.  CT images were obtained.  Surface markings were placed.  The CT images were loaded into the planning software.  Then the prostate target and avoidance structures including the rectum, bladder, bowel and hips were contoured.  Treatment planning then occurred.  The radiation prescription was entered and confirmed.  A total of one complex treatment devices was fabricated. I have requested : Intensity Modulated Radiotherapy (IMRT) is medically necessary for this case for the following reason:  Rectal sparing.Marland Kitchen  PLAN:  The patient will receive 70 Gy in 28 fractions.  ________________________________  Sheral Apley Tammi Klippel, M.D.

## 2018-05-02 ENCOUNTER — Ambulatory Visit
Admission: RE | Admit: 2018-05-02 | Discharge: 2018-05-02 | Disposition: A | Discharge status: Home / Self Care | Payer: PPO | Source: Ambulatory Visit | Attending: Radiation Oncology | Admitting: Radiation Oncology

## 2018-05-02 DIAGNOSIS — Z510 Encounter for antineoplastic radiation therapy: Secondary | ICD-10-CM | POA: Diagnosis not present

## 2018-05-02 DIAGNOSIS — C61 Malignant neoplasm of prostate: Secondary | ICD-10-CM | POA: Diagnosis not present

## 2018-05-05 ENCOUNTER — Ambulatory Visit (HOSPITAL_COMMUNITY)
Admission: RE | Admit: 2018-05-05 | Discharge: 2018-05-05 | Disposition: A | Discharge status: Home / Self Care | Payer: PPO | Source: Ambulatory Visit | Attending: Urology | Admitting: Urology

## 2018-05-05 DIAGNOSIS — C61 Malignant neoplasm of prostate: Secondary | ICD-10-CM | POA: Diagnosis not present

## 2018-05-14 DIAGNOSIS — C61 Malignant neoplasm of prostate: Secondary | ICD-10-CM | POA: Diagnosis not present

## 2018-05-14 DIAGNOSIS — Z510 Encounter for antineoplastic radiation therapy: Secondary | ICD-10-CM | POA: Diagnosis not present

## 2018-05-15 ENCOUNTER — Ambulatory Visit
Admission: RE | Admit: 2018-05-15 | Discharge: 2018-05-15 | Disposition: A | Discharge status: Home / Self Care | Payer: PPO | Source: Ambulatory Visit | Attending: Radiation Oncology | Admitting: Radiation Oncology

## 2018-05-15 DIAGNOSIS — C61 Malignant neoplasm of prostate: Secondary | ICD-10-CM | POA: Diagnosis not present

## 2018-05-15 DIAGNOSIS — Z510 Encounter for antineoplastic radiation therapy: Secondary | ICD-10-CM | POA: Diagnosis not present

## 2018-05-16 ENCOUNTER — Ambulatory Visit
Admission: RE | Admit: 2018-05-16 | Discharge: 2018-05-16 | Disposition: A | Discharge status: Home / Self Care | Payer: PPO | Source: Ambulatory Visit | Attending: Radiation Oncology | Admitting: Radiation Oncology

## 2018-05-16 DIAGNOSIS — Z510 Encounter for antineoplastic radiation therapy: Secondary | ICD-10-CM | POA: Diagnosis not present

## 2018-05-16 DIAGNOSIS — C61 Malignant neoplasm of prostate: Secondary | ICD-10-CM | POA: Diagnosis not present

## 2018-05-19 ENCOUNTER — Ambulatory Visit
Admission: RE | Admit: 2018-05-19 | Discharge: 2018-05-19 | Disposition: A | Discharge status: Home / Self Care | Payer: PPO | Source: Ambulatory Visit | Attending: Radiation Oncology | Admitting: Radiation Oncology

## 2018-05-19 DIAGNOSIS — Z510 Encounter for antineoplastic radiation therapy: Secondary | ICD-10-CM | POA: Diagnosis not present

## 2018-05-19 DIAGNOSIS — C61 Malignant neoplasm of prostate: Secondary | ICD-10-CM | POA: Diagnosis not present

## 2018-05-20 ENCOUNTER — Ambulatory Visit
Admission: RE | Admit: 2018-05-20 | Discharge: 2018-05-20 | Disposition: A | Discharge status: Home / Self Care | Payer: PPO | Source: Ambulatory Visit | Attending: Radiation Oncology | Admitting: Radiation Oncology

## 2018-05-20 DIAGNOSIS — Z510 Encounter for antineoplastic radiation therapy: Secondary | ICD-10-CM | POA: Diagnosis not present

## 2018-05-20 DIAGNOSIS — C61 Malignant neoplasm of prostate: Secondary | ICD-10-CM | POA: Diagnosis not present

## 2018-05-21 ENCOUNTER — Ambulatory Visit
Admission: RE | Admit: 2018-05-21 | Discharge: 2018-05-21 | Disposition: A | Discharge status: Home / Self Care | Payer: PPO | Source: Ambulatory Visit | Attending: Radiation Oncology | Admitting: Radiation Oncology

## 2018-05-21 DIAGNOSIS — Z510 Encounter for antineoplastic radiation therapy: Secondary | ICD-10-CM | POA: Diagnosis not present

## 2018-05-21 DIAGNOSIS — C61 Malignant neoplasm of prostate: Secondary | ICD-10-CM | POA: Diagnosis not present

## 2018-05-22 ENCOUNTER — Ambulatory Visit: Payer: PPO

## 2018-05-23 ENCOUNTER — Ambulatory Visit
Admission: RE | Admit: 2018-05-23 | Discharge: 2018-05-23 | Disposition: A | Discharge status: Home / Self Care | Payer: PPO | Source: Ambulatory Visit | Attending: Radiation Oncology | Admitting: Radiation Oncology

## 2018-05-23 DIAGNOSIS — C61 Malignant neoplasm of prostate: Secondary | ICD-10-CM | POA: Diagnosis not present

## 2018-05-23 DIAGNOSIS — Z510 Encounter for antineoplastic radiation therapy: Secondary | ICD-10-CM | POA: Diagnosis not present

## 2018-05-27 ENCOUNTER — Ambulatory Visit
Admission: RE | Admit: 2018-05-27 | Discharge: 2018-05-27 | Disposition: A | Discharge status: Home / Self Care | Payer: PPO | Source: Ambulatory Visit | Attending: Radiation Oncology | Admitting: Radiation Oncology

## 2018-05-27 DIAGNOSIS — Z510 Encounter for antineoplastic radiation therapy: Secondary | ICD-10-CM | POA: Diagnosis not present

## 2018-05-27 DIAGNOSIS — C61 Malignant neoplasm of prostate: Secondary | ICD-10-CM | POA: Insufficient documentation

## 2018-05-28 ENCOUNTER — Ambulatory Visit
Admission: RE | Admit: 2018-05-28 | Discharge: 2018-05-28 | Disposition: A | Discharge status: Home / Self Care | Payer: PPO | Source: Ambulatory Visit | Attending: Radiation Oncology | Admitting: Radiation Oncology

## 2018-05-28 DIAGNOSIS — Z510 Encounter for antineoplastic radiation therapy: Secondary | ICD-10-CM | POA: Diagnosis not present

## 2018-05-28 DIAGNOSIS — C61 Malignant neoplasm of prostate: Secondary | ICD-10-CM | POA: Diagnosis not present

## 2018-05-29 ENCOUNTER — Ambulatory Visit
Admission: RE | Admit: 2018-05-29 | Discharge: 2018-05-29 | Disposition: A | Discharge status: Home / Self Care | Payer: PPO | Source: Ambulatory Visit | Attending: Radiation Oncology | Admitting: Radiation Oncology

## 2018-05-29 DIAGNOSIS — M545 Low back pain: Secondary | ICD-10-CM | POA: Diagnosis not present

## 2018-05-29 DIAGNOSIS — M79671 Pain in right foot: Secondary | ICD-10-CM | POA: Diagnosis not present

## 2018-05-29 DIAGNOSIS — C61 Malignant neoplasm of prostate: Secondary | ICD-10-CM | POA: Diagnosis not present

## 2018-05-29 DIAGNOSIS — G8929 Other chronic pain: Secondary | ICD-10-CM | POA: Diagnosis not present

## 2018-05-29 DIAGNOSIS — Z510 Encounter for antineoplastic radiation therapy: Secondary | ICD-10-CM | POA: Diagnosis not present

## 2018-05-30 ENCOUNTER — Ambulatory Visit
Admission: RE | Admit: 2018-05-30 | Discharge: 2018-05-30 | Disposition: A | Discharge status: Home / Self Care | Payer: PPO | Source: Ambulatory Visit | Attending: Radiation Oncology | Admitting: Radiation Oncology

## 2018-05-30 ENCOUNTER — Other Ambulatory Visit: Payer: Self-pay

## 2018-05-30 DIAGNOSIS — R300 Dysuria: Secondary | ICD-10-CM

## 2018-05-30 DIAGNOSIS — Z510 Encounter for antineoplastic radiation therapy: Secondary | ICD-10-CM | POA: Diagnosis not present

## 2018-05-30 DIAGNOSIS — C61 Malignant neoplasm of prostate: Secondary | ICD-10-CM | POA: Diagnosis not present

## 2018-05-30 LAB — URINALYSIS, COMPLETE (UACMP) WITH MICROSCOPIC
BACTERIA UA: NONE SEEN
Bilirubin Urine: NEGATIVE
Glucose, UA: NEGATIVE mg/dL
HGB URINE DIPSTICK: NEGATIVE
Ketones, ur: 5 mg/dL — AB
Leukocytes, UA: NEGATIVE
NITRITE: NEGATIVE
Protein, ur: NEGATIVE mg/dL
SPECIFIC GRAVITY, URINE: 1.032 — AB (ref 1.005–1.030)
pH: 5 (ref 5.0–8.0)

## 2018-05-31 LAB — URINE CULTURE: Culture: NO GROWTH

## 2018-06-01 NOTE — Progress Notes (Signed)
Please call patient with normal result.  Thanks. MM 

## 2018-06-02 ENCOUNTER — Other Ambulatory Visit: Payer: Self-pay | Admitting: Radiation Oncology

## 2018-06-02 ENCOUNTER — Ambulatory Visit
Admission: RE | Admit: 2018-06-02 | Discharge: 2018-06-02 | Disposition: A | Discharge status: Home / Self Care | Payer: PPO | Source: Ambulatory Visit | Attending: Radiation Oncology | Admitting: Radiation Oncology

## 2018-06-02 ENCOUNTER — Telehealth: Payer: Self-pay | Admitting: Radiation Oncology

## 2018-06-02 DIAGNOSIS — Z510 Encounter for antineoplastic radiation therapy: Secondary | ICD-10-CM | POA: Diagnosis not present

## 2018-06-02 DIAGNOSIS — C61 Malignant neoplasm of prostate: Secondary | ICD-10-CM | POA: Diagnosis not present

## 2018-06-02 MED ORDER — PHENAZOPYRIDINE HCL 100 MG PO TABS: 100.0000 mg | ORAL_TABLET | Freq: Three times a day (TID) | ORAL | 0 refills | Status: AC | PRN

## 2018-06-02 NOTE — Progress Notes (Addendum)
Patient stopped RN in hallway to inquire if he could have intercourse with his wife while receiving prostate radiation since she was recently diagnosed with herpes. This RN told the patient she would inquire and phone him. Phoned patient and explained that per Shona Simpson, PA-C he should avoid sexual intercourse with his wife until her lesions dry up and sloth off, usually 14 days. Also, reviewed UA and culture results with patient. I explained his dysuria is not related to an infection but instead radiation therapy. Encouraged patient per Shona Simpson, PA-C to begin taking AZO OTC and follow directions on the box. Patient requested a prescription for AZO. Reinforced that AZO is over the counter. Patient states, "I am broke until the end of the month." This RN explained she would request a prescription for pyridium from the providers and phone him back with next steps. Patient confirms his preferred pharmacy is [facility], [place]. Then, patient requested script for pads to absorb urinary leakage. Explained this RN will flag social work and Cira Rue, nurse navigator, for assistance. Finally, patient requested a refill of Cialis. Instructed patient to follow up with his urologist, Dr. Jeffie Pollock, for this since he was the original prescriber and ED is not a side effect of XRT. Patient verbalized understanding of all reviewed.

## 2018-06-03 ENCOUNTER — Encounter: Payer: Self-pay | Admitting: Medical Oncology

## 2018-06-03 ENCOUNTER — Ambulatory Visit
Admission: RE | Admit: 2018-06-03 | Discharge: 2018-06-03 | Disposition: A | Discharge status: Home / Self Care | Payer: PPO | Source: Ambulatory Visit | Attending: Radiation Oncology | Admitting: Radiation Oncology

## 2018-06-03 DIAGNOSIS — C61 Malignant neoplasm of prostate: Secondary | ICD-10-CM | POA: Diagnosis not present

## 2018-06-03 DIAGNOSIS — Z510 Encounter for antineoplastic radiation therapy: Secondary | ICD-10-CM | POA: Diagnosis not present

## 2018-06-04 ENCOUNTER — Ambulatory Visit
Admission: RE | Admit: 2018-06-04 | Discharge: 2018-06-04 | Disposition: A | Discharge status: Home / Self Care | Payer: PPO | Source: Ambulatory Visit | Attending: Radiation Oncology | Admitting: Radiation Oncology

## 2018-06-04 DIAGNOSIS — Z510 Encounter for antineoplastic radiation therapy: Secondary | ICD-10-CM | POA: Diagnosis not present

## 2018-06-04 DIAGNOSIS — C61 Malignant neoplasm of prostate: Secondary | ICD-10-CM | POA: Diagnosis not present

## 2018-06-05 ENCOUNTER — Ambulatory Visit
Admission: RE | Admit: 2018-06-05 | Discharge: 2018-06-05 | Disposition: A | Discharge status: Home / Self Care | Payer: PPO | Source: Ambulatory Visit | Attending: Radiation Oncology | Admitting: Radiation Oncology

## 2018-06-05 DIAGNOSIS — C61 Malignant neoplasm of prostate: Secondary | ICD-10-CM | POA: Diagnosis not present

## 2018-06-05 DIAGNOSIS — Z510 Encounter for antineoplastic radiation therapy: Secondary | ICD-10-CM | POA: Diagnosis not present

## 2018-06-05 NOTE — Progress Notes (Signed)
Charles Wells spoke with Sam-RN yesterday with concerns about no money to purchase AZO and needing male pads for urinary leakage. I spoke with Lauren-SW and she was able to provide pads. [facility] has AZO for $2.12. I spoke with patient and his wife and he states they are on disability and they are low on money. He was very appreciative of the pads and states he can pay $2.12 for AZO. I informed him that I have spoken with Lauren-SW and if needs further assistance I can refer him.

## 2018-06-06 ENCOUNTER — Ambulatory Visit
Admission: RE | Admit: 2018-06-06 | Discharge: 2018-06-06 | Disposition: A | Discharge status: Home / Self Care | Payer: PPO | Source: Ambulatory Visit | Attending: Radiation Oncology | Admitting: Radiation Oncology

## 2018-06-06 DIAGNOSIS — Z510 Encounter for antineoplastic radiation therapy: Secondary | ICD-10-CM | POA: Diagnosis not present

## 2018-06-06 DIAGNOSIS — C61 Malignant neoplasm of prostate: Secondary | ICD-10-CM | POA: Diagnosis not present

## 2018-06-09 ENCOUNTER — Ambulatory Visit
Admission: RE | Admit: 2018-06-09 | Discharge: 2018-06-09 | Disposition: A | Discharge status: Home / Self Care | Payer: PPO | Source: Ambulatory Visit | Attending: Radiation Oncology | Admitting: Radiation Oncology

## 2018-06-09 DIAGNOSIS — M79671 Pain in right foot: Secondary | ICD-10-CM | POA: Diagnosis not present

## 2018-06-09 DIAGNOSIS — M79672 Pain in left foot: Secondary | ICD-10-CM | POA: Diagnosis not present

## 2018-06-09 DIAGNOSIS — M79604 Pain in right leg: Secondary | ICD-10-CM | POA: Diagnosis not present

## 2018-06-09 DIAGNOSIS — Z510 Encounter for antineoplastic radiation therapy: Secondary | ICD-10-CM | POA: Diagnosis not present

## 2018-06-09 DIAGNOSIS — E119 Type 2 diabetes mellitus without complications: Secondary | ICD-10-CM | POA: Diagnosis not present

## 2018-06-09 DIAGNOSIS — C61 Malignant neoplasm of prostate: Secondary | ICD-10-CM | POA: Diagnosis not present

## 2018-06-09 DIAGNOSIS — R110 Nausea: Secondary | ICD-10-CM | POA: Diagnosis not present

## 2018-06-09 DIAGNOSIS — M79605 Pain in left leg: Secondary | ICD-10-CM | POA: Diagnosis not present

## 2018-06-10 ENCOUNTER — Ambulatory Visit
Admission: RE | Admit: 2018-06-10 | Discharge: 2018-06-10 | Disposition: A | Discharge status: Home / Self Care | Payer: PPO | Source: Ambulatory Visit | Attending: Radiation Oncology | Admitting: Radiation Oncology

## 2018-06-10 DIAGNOSIS — C61 Malignant neoplasm of prostate: Secondary | ICD-10-CM | POA: Diagnosis not present

## 2018-06-10 DIAGNOSIS — Z510 Encounter for antineoplastic radiation therapy: Secondary | ICD-10-CM | POA: Diagnosis not present

## 2018-06-11 ENCOUNTER — Ambulatory Visit
Admission: RE | Admit: 2018-06-11 | Discharge: 2018-06-11 | Disposition: A | Discharge status: Home / Self Care | Payer: PPO | Source: Ambulatory Visit | Attending: Radiation Oncology | Admitting: Radiation Oncology

## 2018-06-11 DIAGNOSIS — Z510 Encounter for antineoplastic radiation therapy: Secondary | ICD-10-CM | POA: Diagnosis not present

## 2018-06-11 DIAGNOSIS — C61 Malignant neoplasm of prostate: Secondary | ICD-10-CM | POA: Diagnosis not present

## 2018-06-12 ENCOUNTER — Ambulatory Visit
Admission: RE | Admit: 2018-06-12 | Discharge: 2018-06-12 | Disposition: A | Discharge status: Home / Self Care | Payer: PPO | Source: Ambulatory Visit | Attending: Radiation Oncology | Admitting: Radiation Oncology

## 2018-06-12 DIAGNOSIS — Z510 Encounter for antineoplastic radiation therapy: Secondary | ICD-10-CM | POA: Diagnosis not present

## 2018-06-12 DIAGNOSIS — C61 Malignant neoplasm of prostate: Secondary | ICD-10-CM | POA: Diagnosis not present

## 2018-06-13 ENCOUNTER — Ambulatory Visit
Admission: RE | Admit: 2018-06-13 | Discharge: 2018-06-13 | Disposition: A | Discharge status: Home / Self Care | Payer: PPO | Source: Ambulatory Visit | Attending: Radiation Oncology | Admitting: Radiation Oncology

## 2018-06-13 DIAGNOSIS — Z510 Encounter for antineoplastic radiation therapy: Secondary | ICD-10-CM | POA: Diagnosis not present

## 2018-06-13 DIAGNOSIS — C61 Malignant neoplasm of prostate: Secondary | ICD-10-CM | POA: Diagnosis not present

## 2018-06-16 ENCOUNTER — Ambulatory Visit: Payer: PPO

## 2018-06-17 ENCOUNTER — Ambulatory Visit
Admission: RE | Admit: 2018-06-17 | Discharge: 2018-06-17 | Disposition: A | Discharge status: Home / Self Care | Payer: PPO | Source: Ambulatory Visit | Attending: Radiation Oncology | Admitting: Radiation Oncology

## 2018-06-17 DIAGNOSIS — C61 Malignant neoplasm of prostate: Secondary | ICD-10-CM | POA: Diagnosis not present

## 2018-06-17 DIAGNOSIS — Z510 Encounter for antineoplastic radiation therapy: Secondary | ICD-10-CM | POA: Diagnosis not present

## 2018-06-18 ENCOUNTER — Ambulatory Visit
Admission: RE | Admit: 2018-06-18 | Discharge: 2018-06-18 | Disposition: A | Discharge status: Home / Self Care | Payer: PPO | Source: Ambulatory Visit | Attending: Radiation Oncology | Admitting: Radiation Oncology

## 2018-06-18 DIAGNOSIS — Z510 Encounter for antineoplastic radiation therapy: Secondary | ICD-10-CM | POA: Diagnosis not present

## 2018-06-18 DIAGNOSIS — C61 Malignant neoplasm of prostate: Secondary | ICD-10-CM | POA: Diagnosis not present

## 2018-06-19 ENCOUNTER — Ambulatory Visit
Admission: RE | Admit: 2018-06-19 | Discharge: 2018-06-19 | Disposition: A | Discharge status: Home / Self Care | Payer: PPO | Source: Ambulatory Visit | Attending: Radiation Oncology | Admitting: Radiation Oncology

## 2018-06-19 DIAGNOSIS — C61 Malignant neoplasm of prostate: Secondary | ICD-10-CM | POA: Diagnosis not present

## 2018-06-19 DIAGNOSIS — Z510 Encounter for antineoplastic radiation therapy: Secondary | ICD-10-CM | POA: Diagnosis not present

## 2018-06-19 MED FILL — TADALAFIL 5 MG TABS: 5 | 30 days supply | Qty: 30 | Fill #0

## 2018-06-20 ENCOUNTER — Ambulatory Visit
Admission: RE | Admit: 2018-06-20 | Discharge: 2018-06-20 | Disposition: A | Discharge status: Home / Self Care | Payer: PPO | Source: Ambulatory Visit | Attending: Radiation Oncology | Admitting: Radiation Oncology

## 2018-06-20 DIAGNOSIS — C61 Malignant neoplasm of prostate: Secondary | ICD-10-CM | POA: Diagnosis not present

## 2018-06-20 DIAGNOSIS — Z510 Encounter for antineoplastic radiation therapy: Secondary | ICD-10-CM | POA: Diagnosis not present

## 2018-06-23 ENCOUNTER — Ambulatory Visit
Admission: RE | Admit: 2018-06-23 | Discharge: 2018-06-23 | Disposition: A | Discharge status: Home / Self Care | Payer: PPO | Source: Ambulatory Visit | Attending: Radiation Oncology | Admitting: Radiation Oncology

## 2018-06-23 DIAGNOSIS — Z510 Encounter for antineoplastic radiation therapy: Secondary | ICD-10-CM | POA: Diagnosis not present

## 2018-06-23 DIAGNOSIS — C61 Malignant neoplasm of prostate: Secondary | ICD-10-CM | POA: Diagnosis not present

## 2018-06-24 ENCOUNTER — Ambulatory Visit: Payer: PPO

## 2018-06-24 ENCOUNTER — Ambulatory Visit
Admission: RE | Admit: 2018-06-24 | Discharge: 2018-06-24 | Disposition: A | Discharge status: Home / Self Care | Payer: PPO | Source: Ambulatory Visit | Attending: Radiation Oncology | Admitting: Radiation Oncology

## 2018-06-24 DIAGNOSIS — Z510 Encounter for antineoplastic radiation therapy: Secondary | ICD-10-CM | POA: Insufficient documentation

## 2018-06-24 DIAGNOSIS — C61 Malignant neoplasm of prostate: Secondary | ICD-10-CM | POA: Diagnosis not present

## 2018-06-25 ENCOUNTER — Ambulatory Visit
Admission: RE | Admit: 2018-06-25 | Discharge: 2018-06-25 | Disposition: A | Discharge status: Home / Self Care | Payer: PPO | Source: Ambulatory Visit | Attending: Radiation Oncology | Admitting: Radiation Oncology

## 2018-06-25 ENCOUNTER — Ambulatory Visit: Payer: PPO

## 2018-06-25 DIAGNOSIS — Z510 Encounter for antineoplastic radiation therapy: Secondary | ICD-10-CM | POA: Diagnosis not present

## 2018-06-25 DIAGNOSIS — C61 Malignant neoplasm of prostate: Secondary | ICD-10-CM | POA: Diagnosis not present

## 2018-06-26 ENCOUNTER — Ambulatory Visit
Admission: RE | Admit: 2018-06-26 | Discharge: 2018-06-26 | Disposition: A | Discharge status: Home / Self Care | Payer: PPO | Source: Ambulatory Visit | Attending: Radiation Oncology | Admitting: Radiation Oncology

## 2018-06-26 ENCOUNTER — Encounter: Payer: Self-pay | Admitting: Radiation Oncology

## 2018-06-26 DIAGNOSIS — C61 Malignant neoplasm of prostate: Secondary | ICD-10-CM | POA: Diagnosis not present

## 2018-06-26 DIAGNOSIS — Z510 Encounter for antineoplastic radiation therapy: Secondary | ICD-10-CM | POA: Diagnosis not present

## 2018-06-30 ENCOUNTER — Ambulatory Visit: Payer: PPO | Admitting: Podiatry

## 2018-06-30 ENCOUNTER — Telehealth: Payer: Self-pay | Admitting: Radiation Oncology

## 2018-06-30 NOTE — Progress Notes (Signed)
Received voicemail message from patient requesting a return call. Phoned patient back on his cell. No answer. Left voicemail message with my contact information.

## 2018-07-01 ENCOUNTER — Telehealth: Payer: Self-pay | Admitting: Radiation Oncology

## 2018-07-01 NOTE — Progress Notes (Signed)
Received voicemail message from patient requesting return call on home phone. Phoned patient's home. No answer. Left message with my contact information.

## 2018-07-03 NOTE — Progress Notes (Signed)
  Radiation Oncology         [phone] ________________________________  Name: Charles Wells MRN: [id number]  Date: 06/26/2018  DOB: 12-08-1955  End of Treatment Note  Diagnosis:   62 y.o. 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  Image guidance was performed with daily cone beam CT prior to each fraction to align to gold markers in the prostate and assure proper bladder and rectal fill volumes.  Immobilization was achieved with BodyFix custom mold.  Narrative: The patient tolerated radiation treatment relatively well.   He experienced modest fatigue and some minor urinary irritation with urgency, leakage, nocturia x1-2, and occasional dysuria managed with Azo. He denied having hematuria or bowel issues.  Plan: The patient has completed radiation treatment. He will return to radiation oncology clinic for routine followup in one month. I advised him to call or return sooner if he has any questions or concerns related to his recovery or treatment. ________________________________  Sheral Apley. Tammi Klippel, M.D.  This document serves as a record of services personally performed by Tyler Pita, MD. It was created on his behalf by Rae Lips, a trained medical scribe. The creation of this record is based on the scribe's personal observations and the provider's statements to them. This document has been checked and approved by the attending provider.

## 2018-07-08 ENCOUNTER — Ambulatory Visit: Payer: PPO | Admitting: Podiatry

## 2018-07-21 ENCOUNTER — Encounter: Payer: Self-pay | Admitting: Podiatry

## 2018-07-21 ENCOUNTER — Ambulatory Visit: Payer: PPO | Admitting: Podiatry

## 2018-07-21 DIAGNOSIS — M2041 Other hammer toe(s) (acquired), right foot: Secondary | ICD-10-CM | POA: Diagnosis not present

## 2018-07-21 DIAGNOSIS — E1149 Type 2 diabetes mellitus with other diabetic neurological complication: Secondary | ICD-10-CM

## 2018-07-21 DIAGNOSIS — B351 Tinea unguium: Secondary | ICD-10-CM | POA: Diagnosis not present

## 2018-07-21 DIAGNOSIS — R5383 Other fatigue: Secondary | ICD-10-CM | POA: Diagnosis not present

## 2018-07-21 DIAGNOSIS — M79675 Pain in left toe(s): Secondary | ICD-10-CM | POA: Diagnosis not present

## 2018-07-21 DIAGNOSIS — M2042 Other hammer toe(s) (acquired), left foot: Secondary | ICD-10-CM

## 2018-07-21 DIAGNOSIS — M79674 Pain in right toe(s): Secondary | ICD-10-CM | POA: Diagnosis not present

## 2018-07-21 DIAGNOSIS — M216X9 Other acquired deformities of unspecified foot: Secondary | ICD-10-CM

## 2018-07-21 DIAGNOSIS — L02419 Cutaneous abscess of limb, unspecified: Secondary | ICD-10-CM | POA: Diagnosis not present

## 2018-07-21 DIAGNOSIS — E119 Type 2 diabetes mellitus without complications: Secondary | ICD-10-CM

## 2018-07-27 NOTE — Progress Notes (Signed)
Subjective: 61 year old male presents the office today for diabetic foot exam as well as for elongated, thick toenails that have trimmed.  He is also requesting diabetic shoes.  Otherwise he states he is doing well from a lower extremity standpoint he is undergoing treatment currently for prostate cancer.  He is also gained some weight recently. Denies any systemic complaints such as fevers, chills, nausea, vomiting. No acute changes since last appointment, and no other complaints at this time.   Objective: AAO x3, NAD DP/PT pulses palpable bilaterally, CRT less than 3 seconds Incision from the prior surgery is well-healed there is no significant discomfort to palpation at this area. Nails are hypertrophic, dystrophic, brittle, discolored, elongated 10. No surrounding redness or drainage. Tenderness nails 1-5 bilaterally. No open lesions or pre-ulcerative lesions are identified today. Cavus present as well as hammertoes. No open lesions or pre-ulcerative lesions.  No pain with calf compression, swelling, warmth, erythema  Assessment: 62 year old male with symptomatic onychomycosis, requesting diabetic shoes  Plan: -All treatment options discussed with the patient including all alternatives, risks, complications.  -Nails debrided x10 without any complications or bleeding. -Paperwork completed today for precertification diabetic shoes.  He is already today. -Discussed the importance of daily foot inspection. -Patient encouraged to call the office with any questions, concerns, change in symptoms.   Trula Slade DPM

## 2018-07-29 ENCOUNTER — Other Ambulatory Visit: Payer: Self-pay

## 2018-07-29 ENCOUNTER — Encounter: Payer: Self-pay | Admitting: Urology

## 2018-07-29 ENCOUNTER — Ambulatory Visit
Admission: RE | Admit: 2018-07-29 | Discharge: 2018-07-29 | Disposition: A | Discharge status: Home / Self Care | Payer: PPO | Source: Ambulatory Visit | Attending: Urology | Admitting: Urology

## 2018-07-29 VITALS — BP 132/78 | HR 83 | Temp 97.9°F | Resp 20 | Ht 68.0 in | Wt 238.0 lb

## 2018-07-29 DIAGNOSIS — Z7984 Long term (current) use of oral hypoglycemic drugs: Secondary | ICD-10-CM | POA: Insufficient documentation

## 2018-07-29 DIAGNOSIS — Z923 Personal history of irradiation: Secondary | ICD-10-CM | POA: Insufficient documentation

## 2018-07-29 DIAGNOSIS — C61 Malignant neoplasm of prostate: Secondary | ICD-10-CM | POA: Diagnosis not present

## 2018-07-29 DIAGNOSIS — Z79899 Other long term (current) drug therapy: Secondary | ICD-10-CM | POA: Diagnosis not present

## 2018-07-29 NOTE — Progress Notes (Signed)
Radiation Oncology         [phone] ________________________________  Name: Charles Wells MRN: [id number]  Date: 07/29/2018  DOB: June 17, 1956  Post Treatment Note  CC: Danton Sewer, MD  Diagnosis:   62 y.o. 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  He tolerated radiation treatment relatively well.   He experienced modest fatigue and some minor urinary irritation with urgency, leakage, nocturia x1-2, and occasional dysuria managed with Azo. He denied having hematuria or bowel issues.                              On review of systems, the patient states that he is doing relatively well overall.  He does continue with increased urinary frequency, urgency, intermittency, weak stream and occasional feelings of incomplete emptying.  His current IPSS score is 21 indicating moderate to severe lower urinary tract symptoms.  He continues taking Flomax daily and is also using AZO for dysuria at the very beginning and end of his stream.  He does feel that his symptoms are gradually improving but have not yet resolved.  He denies incontinence or gross hematuria.  He does continue with occasional bowel urgency with multiple loose bowel movements daily but again feels that the symptoms are gradually improving.  He is using Imodium as needed.  He denies abdominal pain, nausea, vomiting or constipation.  He reports a healthy appetite and is maintaining his weight.  He does not currently have a scheduled follow-up appointment with Dr. Jeffie Pollock to his knowledge.  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  Meds: Current Outpatient Medications  Medication Sig Dispense Refill  . albuterol (PROVENTIL  HFA;VENTOLIN HFA) 108 (90 BASE) MCG/ACT inhaler Inhale 2 puffs into the lungs 4 (four) times daily.     Marland Kitchen ALPRAZolam (XANAX) 0.5 MG tablet Take 0.1 mg by mouth 3 (three) times daily as needed for anxiety.    . budesonide-formoterol (SYMBICORT) 160-4.5 MCG/ACT inhaler Inhale 2 puffs into the lungs 2 (two) times daily.    Marland Kitchen buPROPion (WELLBUTRIN SR) 150 MG 12 hr tablet Take 150 mg by mouth 2 (two) times daily.    . cetirizine (ZYRTEC) 10 MG tablet Take 10 mg by mouth at bedtime.    . chlorthalidone (HYGROTON) 25 MG tablet Take 25 mg by mouth daily.    . fluticasone (FLONASE) 50 MCG/ACT nasal spray Place 1 spray into both nostrils 2 (two) times daily.     Marland Kitchen gabapentin (NEURONTIN) 300 MG capsule Take 300 mg by mouth at bedtime.    Marland Kitchen HYDROcodone-acetaminophen (NORCO) 10-325 MG tablet Take 1 tablet by mouth every 6 (six) hours as needed. (Patient taking differently: Take 1 tablet by mouth every 4 (four) hours as needed. ) 20 tablet 0  . ipratropium-albuterol (DUONEB) 0.5-2.5 (3) MG/3ML SOLN Take 3 mLs by nebulization every 6 (six) hours as needed.    Marland Kitchen losartan (COZAAR) 100 MG tablet Take 100 mg by mouth daily.    . magnesium oxide (MAG-OX) 400 MG tablet Take 400 mg by mouth daily.    . metFORMIN (GLUCOPHAGE) 500 MG tablet Take 500 mg  by mouth daily with breakfast.    . montelukast (SINGULAIR) 10 MG tablet Take 10 mg by mouth at bedtime.    Marland Kitchen nystatin (NYSTATIN) powder Apply topically 4 (four) times daily.    Marland Kitchen omeprazole (PRILOSEC) 20 MG capsule Take 20 mg by mouth daily.    . potassium chloride SA (K-DUR,KLOR-CON) 20 MEQ tablet Take 40 mEq by mouth daily. Monday, Wednesday, Friday    . potassium chloride SA (K-DUR,KLOR-CON) 20 MEQ tablet Take 20 mEq by mouth daily. Tuesday, Thursday, Saturday, Sunday    . pravastatin (PRAVACHOL) 40 MG tablet Take 40 mg by mouth at bedtime.    . tadalafil (CIALIS) 5 MG tablet Take 5 mg by mouth daily.    . tamsulosin (FLOMAX) 0.4 MG CAPS capsule Take 0.4 mg by mouth  daily after breakfast.    . Vitamin D, Ergocalciferol, (DRISDOL) 50000 units CAPS capsule Take 50,000 Units by mouth every 7 (seven) days.    . benzonatate (TESSALON) 200 MG capsule TK 1 C PO TID  0  . cyclobenzaprine (FLEXERIL) 10 MG tablet Take 10 mg by mouth.    . diclofenac sodium (VOLTAREN) 1 % GEL Apply topically 4 (four) times daily.    Marland Kitchen HYDROcodone-acetaminophen (NORCO/VICODIN) 5-325 MG tablet Take 1 tablet by mouth every 6 (six) hours as needed for moderate pain.    Marland Kitchen NARCAN 4 MG/0.1ML LIQD nasal spray kit USE AS DIRECTED  0  . phenazopyridine (PYRIDIUM) 100 MG tablet Take 1 tablet (100 mg total) by mouth 3 (three) times daily as needed for pain. (Patient not taking: Reported on 07/29/2018) 90 tablet 0   Current Facility-Administered Medications  Medication Dose Route Frequency Provider Last Rate Last Dose  . triamcinolone acetonide (KENALOG) 10 MG/ML injection 10 mg  10 mg Other Once Trula Slade, DPM        Physical Findings:  height is 5' 8"  (1.727 m) and weight is 238 lb (108 kg). His oral temperature is 97.9 F (36.6 C). His blood pressure is 132/78 and his pulse is 83. His respiration is 20 and oxygen saturation is 96%.  Pain Assessment Pain Score: 0-No pain/10 In general this is a well appearing Caucasian male in no acute distress. He's alert and oriented x4 and appropriate throughout the examination. Cardiopulmonary assessment is negative for acute distress and he exhibits normal effort.   Lab Findings: Lab Results  Component Value Date   WBC 9.4 07/20/2017   HGB 14.7 07/20/2017   HCT 41.4 07/20/2017   MCV 93.9 07/20/2017   PLT 179 07/20/2017     Radiographic Findings: No results found.  Impression/Plan: 1. 62 y.o. 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   He will continue to follow up with urology for ongoing PSA determinations but does not currently have a follow-up  appointment scheduled with Dr. Jeffie Pollock.  If he does not hear from Dr. Ralene Muskrat office prior to the end of this week he will call to schedule a follow-up visit for early January 2020.  He understands what to expect with regards to PSA monitoring going forward. I will look forward to following his response to treatment via correspondence with urology, and would be happy to continue to participate in his care if clinically indicated. I talked to the patient about what to expect in the future, including his risk for erectile dysfunction and rectal bleeding. I encouraged him to call or return to the  office if he has any questions regarding his previous radiation or possible radiation side effects. He was comfortable with this plan and will follow up as needed.    Nicholos Johns, PA-C

## 2018-07-30 ENCOUNTER — Telehealth: Payer: Self-pay | Admitting: *Deleted

## 2018-07-30 ENCOUNTER — Ambulatory Visit (INDEPENDENT_AMBULATORY_CARE_PROVIDER_SITE_OTHER): Payer: PPO | Admitting: Orthotics

## 2018-07-30 DIAGNOSIS — Q828 Other specified congenital malformations of skin: Secondary | ICD-10-CM

## 2018-07-30 DIAGNOSIS — L84 Corns and callosities: Secondary | ICD-10-CM

## 2018-07-30 DIAGNOSIS — M216X9 Other acquired deformities of unspecified foot: Secondary | ICD-10-CM

## 2018-07-30 DIAGNOSIS — E1149 Type 2 diabetes mellitus with other diabetic neurological complication: Secondary | ICD-10-CM

## 2018-07-30 DIAGNOSIS — M2042 Other hammer toe(s) (acquired), left foot: Secondary | ICD-10-CM

## 2018-07-30 DIAGNOSIS — M2041 Other hammer toe(s) (acquired), right foot: Secondary | ICD-10-CM

## 2018-07-30 NOTE — Telephone Encounter (Signed)
Returned patient's phone call, lvm for a return call 

## 2018-07-31 NOTE — Progress Notes (Signed)

## 2018-08-01 ENCOUNTER — Ambulatory Visit: Payer: PPO | Admitting: Podiatry

## 2018-08-01 MED FILL — TADALAFIL 5 MG TABS: 5 | 30 days supply | Qty: 30 | Fill #0

## 2018-08-13 DIAGNOSIS — R0989 Other specified symptoms and signs involving the circulatory and respiratory systems: Secondary | ICD-10-CM | POA: Diagnosis not present

## 2018-08-13 DIAGNOSIS — M545 Low back pain: Secondary | ICD-10-CM | POA: Diagnosis not present

## 2018-08-13 DIAGNOSIS — R1031 Right lower quadrant pain: Secondary | ICD-10-CM | POA: Diagnosis not present

## 2018-08-13 DIAGNOSIS — C61 Malignant neoplasm of prostate: Secondary | ICD-10-CM | POA: Diagnosis not present

## 2018-08-13 DIAGNOSIS — Z79899 Other long term (current) drug therapy: Secondary | ICD-10-CM | POA: Diagnosis not present

## 2018-08-13 DIAGNOSIS — M25551 Pain in right hip: Secondary | ICD-10-CM | POA: Diagnosis not present

## 2018-08-15 ENCOUNTER — Telehealth: Payer: Self-pay | Admitting: Radiation Oncology

## 2018-08-15 ENCOUNTER — Ambulatory Visit
Admission: RE | Admit: 2018-08-15 | Discharge: 2018-08-15 | Disposition: A | Discharge status: Home / Self Care | Payer: PPO | Source: Ambulatory Visit | Attending: Urology | Admitting: Urology

## 2018-08-15 ENCOUNTER — Other Ambulatory Visit: Payer: Self-pay

## 2018-08-15 ENCOUNTER — Encounter: Payer: Self-pay | Admitting: Urology

## 2018-08-15 DIAGNOSIS — C61 Malignant neoplasm of prostate: Secondary | ICD-10-CM | POA: Diagnosis not present

## 2018-08-15 DIAGNOSIS — R1031 Right lower quadrant pain: Secondary | ICD-10-CM | POA: Diagnosis not present

## 2018-08-15 DIAGNOSIS — Z79899 Other long term (current) drug therapy: Secondary | ICD-10-CM | POA: Diagnosis not present

## 2018-08-15 DIAGNOSIS — Z923 Personal history of irradiation: Secondary | ICD-10-CM | POA: Diagnosis not present

## 2018-08-15 NOTE — Telephone Encounter (Signed)
-----   Message from Freeman Caldron, [place] sent at 08/15/2018 12:41 PM EST ----- Regarding: Patient would like a copy of today's visit mailed to his home address. Please print and mail a copy of today's visit to his home address.  Thank you!!! -Ashlyn

## 2018-08-15 NOTE — Telephone Encounter (Signed)
As requested by [employer], PA-C. I printed a copy of today's office note and mail it to the patient's home in [place], [place].

## 2018-08-15 NOTE — Progress Notes (Addendum)
Radiation Oncology         [phone]) [phone] ________________________________  Name: Charles Wells MRN: [id number]  Date: 08/15/2018  DOB: 12/27/1955  Post Treatment Note  CC: Meda Coffee, PA-C  Diagnosis:   62 y.o. 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  He tolerated radiation treatment relatively well and at the time of his routine 1 month follow-up visit was felt to be recovering in standard fashion.  From a radiation standpoint, he has continued to see improvement in his lower urinary tract and bowel symptoms related to his treatments.  He reports that his bowel movements have returned back to normal for the most part and he denies any further dysuria.  He reports a strong steady stream and feels that he empties his bladder well on voiding.  He enies gross hematuria, excessive daytime frequency, urgency or incontinence.  He has noticed persistent increased gas with a very foul odor but denies diarrhea or hematochezia.    His only complaint today really is some right hip/groin discomfort ongoing for the past 2 weeks.  He reports the pain is worse with activity, standing from seated position, lying on the right side at night to sleep and bending at the waist.  When asked about any specific injuries, he admits that he has fallen out of the bed on at least 2 occasions within the past month.  He has been evaluated with his primary care provider, Roque Cash, PA-C at [facility], recently and has had multiple imaging studies and blood work which have all returned normal by the patient's report.  Recent PSA this week at PCP's office was 2.5.                             On review of systems, the patient states that he is doing relatively well overall.  As noted above in HPI, he has continued to notice improvement in his lower urinary tract and bowel symptoms that he was initially experiencing during radiation and shortly thereafter.  He is having some right hip/groin discomfort ongoing for the past 2 weeks and not relieved with use of Vicodin. He reports the pain is worse with activity, standing from seated position, lying on the right side at night to sleep and bending at the waist.  He also reports increased gas/bloating with foul smelling flatus but denies N/V, diarrhea or constipation.   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  Meds: Current Outpatient Medications  Medication Sig Dispense Refill  . albuterol (PROVENTIL HFA;VENTOLIN HFA) 108 (90 BASE) MCG/ACT inhaler Inhale 2 puffs into the lungs 4 (four) times daily.     Marland Kitchen ALPRAZolam (XANAX) 0.5 MG tablet Take 0.1 mg by mouth 3 (three) times daily as needed for anxiety.    . benzonatate (TESSALON) 200 MG capsule TK 1 C PO TID  0  . budesonide-formoterol (SYMBICORT) 160-4.5 MCG/ACT inhaler Inhale 2 puffs into the lungs 2 (two) times daily.    Marland Kitchen buPROPion (WELLBUTRIN SR) 150 MG 12 hr tablet Take 150 mg by mouth 2 (two)  times daily.    . cetirizine (ZYRTEC) 10 MG tablet Take 10 mg by mouth at bedtime.    . chlorthalidone (HYGROTON) 25 MG tablet Take 25 mg by mouth daily.    . diclofenac sodium (VOLTAREN) 1 % GEL Apply topically 4 (four) times daily.    . fluticasone (FLONASE) 50 MCG/ACT nasal spray Place 1 spray into both nostrils 2 (two) times daily.     Marland Kitchen gabapentin (NEURONTIN) 300 MG capsule Take 300 mg by mouth at bedtime.    Marland Kitchen HYDROcodone-acetaminophen (NORCO/VICODIN) 5-325 MG tablet Take 1 tablet by mouth every 6 (six) hours as needed for moderate pain.    Marland Kitchen ipratropium-albuterol (DUONEB) 0.5-2.5 (3) MG/3ML SOLN Take 3 mLs by nebulization every 6 (six) hours as needed.    Marland Kitchen  losartan (COZAAR) 100 MG tablet Take 100 mg by mouth daily.    . magnesium oxide (MAG-OX) 400 MG tablet Take 400 mg by mouth daily.    . metFORMIN (GLUCOPHAGE) 500 MG tablet Take 500 mg by mouth daily with breakfast.    . montelukast (SINGULAIR) 10 MG tablet Take 10 mg by mouth at bedtime.    Marland Kitchen nystatin (NYSTATIN) powder Apply topically 4 (four) times daily.    Marland Kitchen omeprazole (PRILOSEC) 20 MG capsule Take 20 mg by mouth daily.    . phenazopyridine (PYRIDIUM) 100 MG tablet Take 1 tablet (100 mg total) by mouth 3 (three) times daily as needed for pain. 90 tablet 0  . potassium chloride SA (K-DUR,KLOR-CON) 20 MEQ tablet Take 40 mEq by mouth daily. Monday, Wednesday, Friday    . potassium chloride SA (K-DUR,KLOR-CON) 20 MEQ tablet Take 20 mEq by mouth daily. Tuesday, Thursday, Saturday, Sunday    . pravastatin (PRAVACHOL) 40 MG tablet Take 40 mg by mouth at bedtime.    . tadalafil (CIALIS) 5 MG tablet Take 5 mg by mouth daily.    . tamsulosin (FLOMAX) 0.4 MG CAPS capsule Take 0.4 mg by mouth daily after breakfast.    . tiZANidine (ZANAFLEX) 4 MG tablet TK 1 T PO TID  0  . Vitamin D, Ergocalciferol, (DRISDOL) 50000 units CAPS capsule Take 50,000 Units by mouth every 7 (seven) days.    Marland Kitchen NARCAN 4 MG/0.1ML LIQD nasal spray kit USE AS DIRECTED  0   Current Facility-Administered Medications  Medication Dose Route Frequency Provider Last Rate Last Dose  . triamcinolone acetonide (KENALOG) 10 MG/ML injection 10 mg  10 mg Other Once Trula Slade, DPM        Physical Findings:  weight is 238 lb 3.2 oz (108 kg). His oral temperature is 97.8 F (36.6 C). His blood pressure is 134/80 and his pulse is 73. His respiration is 18 and oxygen saturation is 95%.  Pain Assessment Pain Score: 5  Pain Loc: Groin/10 In general this is a well appearing Caucasian male in no acute distress. He's alert and oriented x4 and appropriate throughout the examination. Cardiopulmonary assessment is negative for acute  distress and he exhibits normal effort. He has full ROM in bilateral LEs. There is pain with hip flexion and rotation on the right. Abdomen is soft and non-distended. There is no edema, erythema or cyanosis in the LEs.  Lab Findings: Lab Results  Component Value Date   WBC 9.4 07/20/2017   HGB 14.7 07/20/2017   HCT 41.4 07/20/2017   MCV 93.9 07/20/2017   PLT 179 07/20/2017     Radiographic Findings: No results found.  Impression/Plan: 1. 62 y.o. 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   He will continue to follow up with urology for ongoing PSA determinations and has a follow-up appointment scheduled with Dr. Jeffie Pollock on 09/24/2018. He understands what to expect with regards to PSA monitoring going forward. He had a PSA checked with his PCP earlier this week and was noted at 2.5 which is encouraging.  I will look forward to following his response to treatment via correspondence with urology, and would be happy to continue to participate in his care if clinically indicated. I talked to the patient about what to expect in the future, including his risk for erectile dysfunction and rectal bleeding. I encouraged him to call or return to the office if he has any questions regarding his previous radiation or possible radiation side effects. He was comfortable with this plan and will follow up as needed.  2. Right hip/groin discomfort.  This does not appear to be related to his recent radiotherapy and instead is more likely a groin strain/pulled muscle based on history of recent falls and physical exam. He has a scheduled consult with GI on Tuesday, 08/19/18.  In the interim, I have advised use of Gas-X to see if this improves the bloating and flatus issues which could be related to some residual bowel inflammation/irritation from recent pelvic radiation and should continue to gradually improve over the next 4-6 weeks.       Nicholos Johns, PA-C

## 2018-08-15 NOTE — Progress Notes (Signed)
Patient presented to the clinic without an appointment insisting to be seen. Weight and vitals stable. Reports right groin pain 5 on a scale of 0-10. Reports this pain has been present since the completion of radiation therapy but has gradually gotten worse. Reports he has fallen out of his bed once toward the end of radiation therapy and again once since completion of radiation. Reports urgency of bowels. Reports increased flatus that is foul. Reports bowel movements are of normal consistency and frequency. Denies dysuria. Reports he stopped taking AZO approximately five days ago. Describes a steady urine stream without difficulty emptying his bladder. Reports the pain in his right groin is worse when he lean forward or uses his abdominal muscles. Reports he has been evaluated by his PCP and is was unable to find any abnormalities. Reports ultrasounds was normal, blood work normal, gallbladder and appendix are normal. Scheduled to follow up with Dr. Jeffie Pollock on 09/25/2018. Scheduled to see a gastroenterologist on Tuesday. Understands from Dr. Lennice Sites his PSA is down to 2.5. All findings reported to [employer], PA-C.   BP 134/80   Pulse 73   Temp 97.8 F (36.6 C) (Oral)   Resp 18   Wt 238 lb 3.2 oz (108 kg)   SpO2 95%   BMI 36.22 kg/m  Wt Readings from Last 3 Encounters:  08/15/18 238 lb 3.2 oz (108 kg)  07/29/18 238 lb (108 kg)  03/04/18 236 lb 9.6 oz (107.3 kg)

## 2018-08-19 DIAGNOSIS — R159 Full incontinence of feces: Secondary | ICD-10-CM | POA: Diagnosis not present

## 2018-08-19 DIAGNOSIS — R1031 Right lower quadrant pain: Secondary | ICD-10-CM | POA: Diagnosis not present

## 2018-08-19 DIAGNOSIS — R140 Abdominal distension (gaseous): Secondary | ICD-10-CM | POA: Diagnosis not present

## 2018-08-26 ENCOUNTER — Telehealth: Payer: Self-pay | Admitting: Podiatry

## 2018-08-26 NOTE — Telephone Encounter (Signed)
Left message requesting call back with the medicated cream he requested.

## 2018-08-26 NOTE — Telephone Encounter (Signed)
Pt called requesting refill on medicated cream for foot pain. Please give pt a call back.

## 2018-08-29 MED ORDER — NONFORMULARY OR COMPOUNDED ITEM: 5 refills | Status: AC

## 2018-08-29 NOTE — Addendum Note (Signed)
Addended by: Harriett Sine D on: 08/29/2018 02:05 PM   Modules accepted: Orders

## 2018-08-29 NOTE — Telephone Encounter (Signed)
I informed pt of Dr. Leigh Aurora orders. Pt states he would like to try the [facility] cream, and will use the other suggested creams until compound arrives. Faxed orders to [facility] cream.

## 2018-08-29 NOTE — Telephone Encounter (Signed)
Pt states he had radiation and his feet and fingers tingle and hurt, Dr. Jacqualyn Posey said the name of a cream, but he didn't know if it needed to be sent out.

## 2018-08-29 NOTE — Telephone Encounter (Signed)
Can do the compound cream for neuropathy to see if this will help. Otherwise he can do an OTC capsicin cream ore even asper cream with lidocaine

## 2018-09-04 ENCOUNTER — Telehealth: Payer: Self-pay | Admitting: Podiatry

## 2018-09-04 NOTE — Telephone Encounter (Signed)
Called pt back to schedule an appt to pick up diabetic shoes.  He said that he did not get the original cream for the foot pain because it cost $60.00. But did get something simular and is still having pain under the little toe and side of big toe. Offered appt to see Dr Jacqualyn Posey on 12.17 when he is scheduled to see Liliane Channel and pt said he that he did not want an appt.

## 2018-09-08 DIAGNOSIS — K651 Peritoneal abscess: Secondary | ICD-10-CM | POA: Diagnosis not present

## 2018-09-08 NOTE — Telephone Encounter (Signed)
I will be happy to see him if he needs.

## 2018-09-09 ENCOUNTER — Encounter (INDEPENDENT_AMBULATORY_CARE_PROVIDER_SITE_OTHER): Payer: Self-pay

## 2018-09-09 ENCOUNTER — Ambulatory Visit (INDEPENDENT_AMBULATORY_CARE_PROVIDER_SITE_OTHER): Payer: PPO | Admitting: Orthotics

## 2018-09-09 DIAGNOSIS — M2042 Other hammer toe(s) (acquired), left foot: Secondary | ICD-10-CM

## 2018-09-09 DIAGNOSIS — E1149 Type 2 diabetes mellitus with other diabetic neurological complication: Secondary | ICD-10-CM | POA: Diagnosis not present

## 2018-09-09 DIAGNOSIS — M216X9 Other acquired deformities of unspecified foot: Secondary | ICD-10-CM

## 2018-09-09 DIAGNOSIS — M2041 Other hammer toe(s) (acquired), right foot: Secondary | ICD-10-CM | POA: Diagnosis not present

## 2018-09-09 NOTE — Progress Notes (Signed)

## 2018-09-11 DIAGNOSIS — N401 Enlarged prostate with lower urinary tract symptoms: Secondary | ICD-10-CM | POA: Diagnosis not present

## 2018-09-11 DIAGNOSIS — C61 Malignant neoplasm of prostate: Secondary | ICD-10-CM | POA: Diagnosis not present

## 2018-09-11 DIAGNOSIS — R339 Retention of urine, unspecified: Secondary | ICD-10-CM | POA: Diagnosis not present

## 2018-09-11 DIAGNOSIS — R972 Elevated prostate specific antigen [PSA]: Secondary | ICD-10-CM | POA: Diagnosis not present

## 2018-09-25 DIAGNOSIS — C61 Malignant neoplasm of prostate: Secondary | ICD-10-CM | POA: Diagnosis not present

## 2018-09-29 DIAGNOSIS — N5201 Erectile dysfunction due to arterial insufficiency: Secondary | ICD-10-CM | POA: Diagnosis not present

## 2018-09-29 DIAGNOSIS — C61 Malignant neoplasm of prostate: Secondary | ICD-10-CM | POA: Diagnosis not present

## 2018-09-29 DIAGNOSIS — R350 Frequency of micturition: Secondary | ICD-10-CM | POA: Diagnosis not present

## 2018-09-29 DIAGNOSIS — N401 Enlarged prostate with lower urinary tract symptoms: Secondary | ICD-10-CM | POA: Diagnosis not present

## 2018-09-29 DIAGNOSIS — R972 Elevated prostate specific antigen [PSA]: Secondary | ICD-10-CM | POA: Diagnosis not present

## 2018-09-29 MED FILL — TADALAFIL 20 MG TABS: 20 | 26 days supply | Qty: 26 | Fill #0

## 2018-09-30 DIAGNOSIS — R1031 Right lower quadrant pain: Secondary | ICD-10-CM | POA: Diagnosis not present

## 2018-09-30 DIAGNOSIS — R159 Full incontinence of feces: Secondary | ICD-10-CM | POA: Diagnosis not present

## 2018-10-08 DIAGNOSIS — G894 Chronic pain syndrome: Secondary | ICD-10-CM | POA: Diagnosis not present

## 2018-10-08 DIAGNOSIS — J069 Acute upper respiratory infection, unspecified: Secondary | ICD-10-CM | POA: Diagnosis not present

## 2018-10-08 DIAGNOSIS — F419 Anxiety disorder, unspecified: Secondary | ICD-10-CM | POA: Diagnosis not present

## 2018-10-08 DIAGNOSIS — I10 Essential (primary) hypertension: Secondary | ICD-10-CM | POA: Diagnosis not present

## 2018-10-27 MED FILL — TADALAFIL 5 MG TABS: 5 | 30 days supply | Qty: 30 | Fill #1

## 2018-10-27 MED FILL — TADALAFIL 20 MG TABS: 20 | 30 days supply | Qty: 30 | Fill #1

## 2018-11-25 MED FILL — TADALAFIL 5 MG TABS: 5 | 30 days supply | Qty: 30 | Fill #2

## 2018-12-11 DIAGNOSIS — E538 Deficiency of other specified B group vitamins: Secondary | ICD-10-CM | POA: Diagnosis not present

## 2018-12-11 DIAGNOSIS — R5383 Other fatigue: Secondary | ICD-10-CM | POA: Diagnosis not present

## 2018-12-11 DIAGNOSIS — F419 Anxiety disorder, unspecified: Secondary | ICD-10-CM | POA: Diagnosis not present

## 2018-12-11 DIAGNOSIS — M159 Polyosteoarthritis, unspecified: Secondary | ICD-10-CM | POA: Diagnosis not present

## 2019-02-23 DIAGNOSIS — M159 Polyosteoarthritis, unspecified: Secondary | ICD-10-CM | POA: Diagnosis not present

## 2019-02-23 DIAGNOSIS — F419 Anxiety disorder, unspecified: Secondary | ICD-10-CM | POA: Diagnosis not present

## 2019-02-23 DIAGNOSIS — G629 Polyneuropathy, unspecified: Secondary | ICD-10-CM | POA: Diagnosis not present

## 2019-02-23 DIAGNOSIS — G894 Chronic pain syndrome: Secondary | ICD-10-CM | POA: Diagnosis not present

## 2019-04-07 MED FILL — TADALAFIL 20 MG TABS: 20 | 26 days supply | Qty: 26 | Fill #1

## 2019-04-07 MED FILL — TADALAFIL 5 MG TABS: 5 | 30 days supply | Qty: 30 | Fill #3

## 2019-05-26 ENCOUNTER — Other Ambulatory Visit: Payer: Self-pay

## 2019-05-26 ENCOUNTER — Ambulatory Visit (INDEPENDENT_AMBULATORY_CARE_PROVIDER_SITE_OTHER): Payer: PPO | Admitting: Podiatry

## 2019-05-26 ENCOUNTER — Other Ambulatory Visit: Payer: Self-pay | Admitting: Podiatry

## 2019-05-26 ENCOUNTER — Ambulatory Visit (INDEPENDENT_AMBULATORY_CARE_PROVIDER_SITE_OTHER): Payer: PPO

## 2019-05-26 DIAGNOSIS — M216X9 Other acquired deformities of unspecified foot: Secondary | ICD-10-CM | POA: Diagnosis not present

## 2019-05-26 DIAGNOSIS — G8929 Other chronic pain: Secondary | ICD-10-CM

## 2019-05-26 DIAGNOSIS — M2041 Other hammer toe(s) (acquired), right foot: Secondary | ICD-10-CM | POA: Diagnosis not present

## 2019-05-26 DIAGNOSIS — M722 Plantar fascial fibromatosis: Secondary | ICD-10-CM

## 2019-05-26 DIAGNOSIS — M79673 Pain in unspecified foot: Secondary | ICD-10-CM

## 2019-05-26 DIAGNOSIS — M2042 Other hammer toe(s) (acquired), left foot: Secondary | ICD-10-CM

## 2019-05-26 DIAGNOSIS — M79671 Pain in right foot: Secondary | ICD-10-CM

## 2019-05-26 DIAGNOSIS — E1149 Type 2 diabetes mellitus with other diabetic neurological complication: Secondary | ICD-10-CM | POA: Diagnosis not present

## 2019-05-26 DIAGNOSIS — M79672 Pain in left foot: Secondary | ICD-10-CM

## 2019-06-03 NOTE — Progress Notes (Signed)
Subjective: 63 year old male presents the office today for concerns of burning pain to the bottoms of both of his feet.  He states he had a lot of pain the bottom of his heels he feels that the plantar fasciitis is coming back.  He is also requesting new diabetic shoes.  Denies any recent injury to his feet no pain swelling or redness.  Surgical site continues to do well.  Get some occasional swelling of both legs.  He has had compression socks previously but does not wear them currently. Denies any systemic complaints such as fevers, chills, nausea, vomiting. No acute changes since last appointment, and no other complaints at this time.   Objective: AAO x3, NAD DP/PT pulses palpable bilaterally, CRT less than 3 seconds Sensation decreased with Semmes Weinstein monofilament There is tenderness along the plantar medial tubercle of the calcaneus insertion of infection bilateral.  Plantar fascia appears to be intact.  Achilles tendon appears to be intact. Mild swelling but swelling to bilateral lower extremities.  No erythema or warmth. Cavsus foot type; hammertoes present.  No open lesions or pre-ulcerative lesions.  No pain with calf compression, swelling, warmth, erythema  Assessment: Type 2 diabetes with neuropathy, plantar fasciitis, swelling  Plan: -All treatment options discussed with the patient including all alternatives, risks, complications.  - X-rays were obtained reviewed.  No evidence of acute fracture or stress fracture.  Hardware intact in the right side. -Plantar fascial injections performed bilaterally.  See procedure note below.  Discussed traction, icing daily. -We discussed changing medication for the neuropathy.  Continue gabapentin.  Discussed possible neurology evaluation.  Also follow-up with his primary care physician. -He was measured today for new diabetic shoes with [facility]  -Information given in regards to get new compression socks. -Patient encouraged to call the  office with any questions, concerns, change in symptoms.   Procedure: Injection Tendon/Ligament Discussed alternatives, risks, complications and verbal consent was obtained.  Location: Bilateral plantar fascia at the glabrous junction; medial approach. Skin Prep: Alcohol  Injectate: 0.5cc 0.5% marcaine plain, 0.5 cc 2% lidocaine plain and, 1 cc kenalog 10. Disposition: Patient tolerated procedure well. Injection site dressed with a band-aid.  Post-injection care was discussed and return precautions discussed.   Trula Slade DPM

## 2019-06-04 DIAGNOSIS — C61 Malignant neoplasm of prostate: Secondary | ICD-10-CM | POA: Diagnosis not present

## 2019-06-04 DIAGNOSIS — R5383 Other fatigue: Secondary | ICD-10-CM | POA: Diagnosis not present

## 2019-06-04 DIAGNOSIS — E782 Mixed hyperlipidemia: Secondary | ICD-10-CM | POA: Diagnosis not present

## 2019-06-04 DIAGNOSIS — E1165 Type 2 diabetes mellitus with hyperglycemia: Secondary | ICD-10-CM | POA: Diagnosis not present

## 2019-06-04 DIAGNOSIS — M159 Polyosteoarthritis, unspecified: Secondary | ICD-10-CM | POA: Diagnosis not present

## 2019-06-04 DIAGNOSIS — Z0000 Encounter for general adult medical examination without abnormal findings: Secondary | ICD-10-CM | POA: Diagnosis not present

## 2019-06-04 DIAGNOSIS — F419 Anxiety disorder, unspecified: Secondary | ICD-10-CM | POA: Diagnosis not present

## 2019-06-04 DIAGNOSIS — Z114 Encounter for screening for human immunodeficiency virus [HIV]: Secondary | ICD-10-CM | POA: Diagnosis not present

## 2019-06-04 DIAGNOSIS — G629 Polyneuropathy, unspecified: Secondary | ICD-10-CM | POA: Diagnosis not present

## 2019-06-04 DIAGNOSIS — Z79899 Other long term (current) drug therapy: Secondary | ICD-10-CM | POA: Diagnosis not present

## 2019-06-04 MED FILL — TADALAFIL 20 MG TABS: 20 | 26 days supply | Qty: 26 | Fill #2

## 2019-06-04 MED FILL — TADALAFIL 5 MG TABS: 5 | 30 days supply | Qty: 30 | Fill #4

## 2019-06-11 DIAGNOSIS — N401 Enlarged prostate with lower urinary tract symptoms: Secondary | ICD-10-CM | POA: Diagnosis not present

## 2019-06-11 DIAGNOSIS — C61 Malignant neoplasm of prostate: Secondary | ICD-10-CM | POA: Diagnosis not present

## 2019-06-11 DIAGNOSIS — N5201 Erectile dysfunction due to arterial insufficiency: Secondary | ICD-10-CM | POA: Diagnosis not present

## 2019-06-11 DIAGNOSIS — R350 Frequency of micturition: Secondary | ICD-10-CM | POA: Diagnosis not present

## 2019-06-25 ENCOUNTER — Telehealth: Payer: Self-pay | Admitting: Podiatry

## 2019-06-25 NOTE — Telephone Encounter (Signed)
Received call last week about pts diabetic shoes from [employer] stating pt does not qualify for shoes until December since he got a pair December 2019. I told [facility] per medicare pt qualifies on a calendar yr not fiscal yr plan. She was going to forward to the medical director to see if he would approve it.  I called pt and left message that we are waiting on [employer] auth and they are not wanting to approve due to last yr he received them in December and I have passed it to the medical director and if he wanted he could call them.

## 2019-06-25 NOTE — Telephone Encounter (Signed)
Deb from [facility] [employer] left a message needing date pt received the diabetic shoes last year for the medical director as he is reviewing the request for shoes for 2020.  I returned call and gave Deb @ [employer] the date pt picked up shoes 12.17.2019.Charles Wells

## 2019-06-26 ENCOUNTER — Telehealth: Payer: Self-pay | Admitting: Podiatry

## 2019-06-26 NOTE — Telephone Encounter (Signed)
Received [employer] auth for diabetic shoes. Left message for pt to call to schedule an appt to pick them up.Marland KitchenMarland Kitchen

## 2019-06-30 ENCOUNTER — Ambulatory Visit: Payer: PPO | Admitting: Orthotics

## 2019-07-06 DIAGNOSIS — G629 Polyneuropathy, unspecified: Secondary | ICD-10-CM | POA: Diagnosis not present

## 2019-07-06 DIAGNOSIS — F419 Anxiety disorder, unspecified: Secondary | ICD-10-CM | POA: Diagnosis not present

## 2019-07-06 DIAGNOSIS — Z7189 Other specified counseling: Secondary | ICD-10-CM | POA: Diagnosis not present

## 2019-07-06 DIAGNOSIS — Z1159 Encounter for screening for other viral diseases: Secondary | ICD-10-CM | POA: Diagnosis not present

## 2019-07-06 DIAGNOSIS — M159 Polyosteoarthritis, unspecified: Secondary | ICD-10-CM | POA: Diagnosis not present

## 2019-07-20 ENCOUNTER — Ambulatory Visit (INDEPENDENT_AMBULATORY_CARE_PROVIDER_SITE_OTHER): Payer: PPO | Admitting: Orthotics

## 2019-07-20 ENCOUNTER — Other Ambulatory Visit: Payer: Self-pay

## 2019-07-20 DIAGNOSIS — E1149 Type 2 diabetes mellitus with other diabetic neurological complication: Secondary | ICD-10-CM | POA: Diagnosis not present

## 2019-07-20 DIAGNOSIS — M2041 Other hammer toe(s) (acquired), right foot: Secondary | ICD-10-CM | POA: Diagnosis not present

## 2019-07-20 DIAGNOSIS — M722 Plantar fascial fibromatosis: Secondary | ICD-10-CM

## 2019-07-20 DIAGNOSIS — M2042 Other hammer toe(s) (acquired), left foot: Secondary | ICD-10-CM | POA: Diagnosis not present

## 2019-07-20 DIAGNOSIS — M79673 Pain in unspecified foot: Secondary | ICD-10-CM

## 2019-07-20 DIAGNOSIS — G8929 Other chronic pain: Secondary | ICD-10-CM

## 2019-07-20 NOTE — Progress Notes (Signed)
Patient had appointment today for definitive and final diabetic shoe fitting and delivery.  Patient was seen by Betha, C.Ped, [facility].   The inserts fit well and accomplished full contact with the plantar surface of the foot bilateral; the shoes fit well and offered forefoot freedom, no noticible heel slippage, and good toe clearance w/ the insert in place.  Patient was advised to monitor of any skin irritation, breakdown.  Patient was satisfied with fit and function. 

## 2019-07-24 MED FILL — TADALAFIL 5 MG TABS: 5 | 30 days supply | Qty: 30 | Fill #5

## 2019-09-03 DIAGNOSIS — R1084 Generalized abdominal pain: Secondary | ICD-10-CM | POA: Diagnosis not present

## 2019-09-03 DIAGNOSIS — Z9189 Other specified personal risk factors, not elsewhere classified: Secondary | ICD-10-CM | POA: Diagnosis not present

## 2019-09-03 DIAGNOSIS — M159 Polyosteoarthritis, unspecified: Secondary | ICD-10-CM | POA: Diagnosis not present

## 2019-09-03 DIAGNOSIS — K047 Periapical abscess without sinus: Secondary | ICD-10-CM | POA: Diagnosis not present

## 2019-09-05 DIAGNOSIS — R0902 Hypoxemia: Secondary | ICD-10-CM | POA: Diagnosis not present

## 2019-09-05 DIAGNOSIS — R05 Cough: Secondary | ICD-10-CM | POA: Diagnosis not present

## 2019-09-05 DIAGNOSIS — J069 Acute upper respiratory infection, unspecified: Secondary | ICD-10-CM | POA: Diagnosis not present

## 2019-09-05 DIAGNOSIS — L0291 Cutaneous abscess, unspecified: Secondary | ICD-10-CM | POA: Diagnosis not present

## 2019-09-05 DIAGNOSIS — Z1159 Encounter for screening for other viral diseases: Secondary | ICD-10-CM | POA: Diagnosis not present

## 2019-09-05 DIAGNOSIS — R509 Fever, unspecified: Secondary | ICD-10-CM | POA: Diagnosis not present

## 2019-09-11 MED FILL — TADALAFIL 5 MG TABS: 5 | 30 days supply | Qty: 30 | Fill #0

## 2019-09-24 MED FILL — TADALAFIL 5 MG TABS: 5 | 30 days supply | Qty: 30 | Fill #0

## 2019-09-30 DIAGNOSIS — Z23 Encounter for immunization: Secondary | ICD-10-CM | POA: Diagnosis not present

## 2019-11-09 MED FILL — TADALAFIL 5 MG TABS: 5 | 60 days supply | Qty: 60 | Fill #1

## 2019-11-24 DIAGNOSIS — Z20822 Contact with and (suspected) exposure to covid-19: Secondary | ICD-10-CM | POA: Diagnosis not present

## 2019-11-24 DIAGNOSIS — Z9189 Other specified personal risk factors, not elsewhere classified: Secondary | ICD-10-CM | POA: Diagnosis not present

## 2019-12-09 DIAGNOSIS — F419 Anxiety disorder, unspecified: Secondary | ICD-10-CM | POA: Diagnosis not present

## 2019-12-09 DIAGNOSIS — M25562 Pain in left knee: Secondary | ICD-10-CM | POA: Diagnosis not present

## 2019-12-09 DIAGNOSIS — C61 Malignant neoplasm of prostate: Secondary | ICD-10-CM | POA: Diagnosis not present

## 2019-12-09 MED FILL — TADALAFIL 20 MG TABS: 20 | 26 days supply | Qty: 26 | Fill #0

## 2019-12-16 DIAGNOSIS — Z8546 Personal history of malignant neoplasm of prostate: Secondary | ICD-10-CM | POA: Diagnosis not present

## 2019-12-16 DIAGNOSIS — R351 Nocturia: Secondary | ICD-10-CM | POA: Diagnosis not present

## 2019-12-16 DIAGNOSIS — N5201 Erectile dysfunction due to arterial insufficiency: Secondary | ICD-10-CM | POA: Diagnosis not present

## 2019-12-16 DIAGNOSIS — N401 Enlarged prostate with lower urinary tract symptoms: Secondary | ICD-10-CM | POA: Diagnosis not present

## 2019-12-28 ENCOUNTER — Ambulatory Visit: Payer: PPO | Admitting: Podiatry

## 2019-12-28 ENCOUNTER — Other Ambulatory Visit: Payer: Self-pay | Admitting: Podiatry

## 2019-12-28 ENCOUNTER — Ambulatory Visit (INDEPENDENT_AMBULATORY_CARE_PROVIDER_SITE_OTHER): Payer: PPO

## 2019-12-28 ENCOUNTER — Other Ambulatory Visit: Payer: Self-pay

## 2019-12-28 ENCOUNTER — Encounter: Payer: Self-pay | Admitting: Podiatry

## 2019-12-28 VITALS — Temp 97.1°F

## 2019-12-28 DIAGNOSIS — E1149 Type 2 diabetes mellitus with other diabetic neurological complication: Secondary | ICD-10-CM

## 2019-12-28 DIAGNOSIS — M779 Enthesopathy, unspecified: Secondary | ICD-10-CM | POA: Diagnosis not present

## 2019-12-28 DIAGNOSIS — M216X9 Other acquired deformities of unspecified foot: Secondary | ICD-10-CM

## 2019-12-28 DIAGNOSIS — M79671 Pain in right foot: Secondary | ICD-10-CM

## 2019-12-28 NOTE — Patient Instructions (Signed)
Look at getting capsaicin cream

## 2019-12-28 NOTE — Progress Notes (Signed)
Subjective: 64 year old male presents the office with concerns of right foot pain, lateral aspect gets discomfort which is on the for last 2 months or more.  The pain is not every day and is intermittent and is worse when he steps a certain way.  No recent injury.  No swelling.  He said no recent treatment.  Is no other concerns. Denies any systemic complaints such as fevers, chills, nausea, vomiting. No acute changes since last appointment, and no other complaints at this time.   Objective: AAO x3, NAD DP/PT pulses palpable bilaterally, CRT less than 3 seconds There is tenderness in the fifth metatarsal base the lateral aspect of the right foot as well as just distal to this area.  No sitting edema there is no erythema or warmth.  There is no area of pinpoint tenderness.  Cavus foot type is present. No open lesions or pre-ulcerative lesions.  No pain with calf compression, swelling, warmth, erythema  Assessment: Insertional peroneal tendinitis, lateral foot pain  Plan: -All treatment options discussed with the patient including all alternatives, risks, complications.  -X-rays obtained reviewed there is no evidence of acute fracture.  Hardware intact from prior surgery. -Steroid injection performed.  A mixture of quarter cc dexamethasone phosphate, 4 cc Marcaine plain was infiltrated on the fifth metatarsal base and just distal to this area.  Care was taken not to inject around the peroneal tendon.  Post procedure instructions were discussed. -I added a lateral wedge to his insert to see if this will take pressure off the lateral foot. -He tried Voltaren gel which is not helpful.  Discussed capsaicin cream. -Patient encouraged to call the office with any questions, concerns, change in symptoms.   Trula Slade DPM

## 2020-01-11 DIAGNOSIS — M25562 Pain in left knee: Secondary | ICD-10-CM | POA: Diagnosis not present

## 2020-01-11 DIAGNOSIS — M545 Low back pain: Secondary | ICD-10-CM | POA: Diagnosis not present

## 2020-01-25 ENCOUNTER — Ambulatory Visit: Payer: PPO | Admitting: Podiatry

## 2020-02-15 MED FILL — TADALAFIL 5 MG TABS: 5 | 60 days supply | Qty: 60 | Fill #2

## 2020-02-23 DIAGNOSIS — M25562 Pain in left knee: Secondary | ICD-10-CM | POA: Diagnosis not present

## 2020-02-23 DIAGNOSIS — I10 Essential (primary) hypertension: Secondary | ICD-10-CM | POA: Diagnosis not present

## 2020-02-23 DIAGNOSIS — R062 Wheezing: Secondary | ICD-10-CM | POA: Diagnosis not present

## 2020-02-23 DIAGNOSIS — R0989 Other specified symptoms and signs involving the circulatory and respiratory systems: Secondary | ICD-10-CM | POA: Diagnosis not present

## 2020-04-30 DIAGNOSIS — T1511XA Foreign body in conjunctival sac, right eye, initial encounter: Secondary | ICD-10-CM | POA: Diagnosis not present

## 2020-05-12 MED FILL — TADALAFIL 5 MG TABS: 5 | 90 days supply | Qty: 90 | Fill #0

## 2020-05-27 DIAGNOSIS — Z79899 Other long term (current) drug therapy: Secondary | ICD-10-CM | POA: Diagnosis not present

## 2020-05-27 DIAGNOSIS — Z20822 Contact with and (suspected) exposure to covid-19: Secondary | ICD-10-CM | POA: Diagnosis not present

## 2020-05-27 DIAGNOSIS — B356 Tinea cruris: Secondary | ICD-10-CM | POA: Diagnosis not present

## 2020-05-27 DIAGNOSIS — Z1159 Encounter for screening for other viral diseases: Secondary | ICD-10-CM | POA: Diagnosis not present

## 2020-05-27 DIAGNOSIS — E1165 Type 2 diabetes mellitus with hyperglycemia: Secondary | ICD-10-CM | POA: Diagnosis not present

## 2020-05-27 DIAGNOSIS — M545 Low back pain: Secondary | ICD-10-CM | POA: Diagnosis not present

## 2020-06-08 DIAGNOSIS — C61 Malignant neoplasm of prostate: Secondary | ICD-10-CM | POA: Diagnosis not present

## 2020-06-15 DIAGNOSIS — R351 Nocturia: Secondary | ICD-10-CM | POA: Diagnosis not present

## 2020-06-15 DIAGNOSIS — N401 Enlarged prostate with lower urinary tract symptoms: Secondary | ICD-10-CM | POA: Diagnosis not present

## 2020-06-15 DIAGNOSIS — N5201 Erectile dysfunction due to arterial insufficiency: Secondary | ICD-10-CM | POA: Diagnosis not present

## 2020-06-15 DIAGNOSIS — Z8546 Personal history of malignant neoplasm of prostate: Secondary | ICD-10-CM | POA: Diagnosis not present

## 2020-06-27 DIAGNOSIS — I10 Essential (primary) hypertension: Secondary | ICD-10-CM | POA: Diagnosis not present

## 2020-06-27 DIAGNOSIS — M25562 Pain in left knee: Secondary | ICD-10-CM | POA: Diagnosis not present

## 2020-06-27 DIAGNOSIS — Z79899 Other long term (current) drug therapy: Secondary | ICD-10-CM | POA: Diagnosis not present

## 2020-06-27 DIAGNOSIS — F419 Anxiety disorder, unspecified: Secondary | ICD-10-CM | POA: Diagnosis not present

## 2020-08-29 ENCOUNTER — Ambulatory Visit: Payer: PPO | Admitting: Podiatry

## 2020-08-29 ENCOUNTER — Other Ambulatory Visit: Payer: PPO | Admitting: Orthotics

## 2020-09-07 DIAGNOSIS — F419 Anxiety disorder, unspecified: Secondary | ICD-10-CM | POA: Diagnosis not present

## 2020-09-07 DIAGNOSIS — M25562 Pain in left knee: Secondary | ICD-10-CM | POA: Diagnosis not present

## 2020-09-07 DIAGNOSIS — I10 Essential (primary) hypertension: Secondary | ICD-10-CM | POA: Diagnosis not present

## 2020-09-07 DIAGNOSIS — Z9189 Other specified personal risk factors, not elsewhere classified: Secondary | ICD-10-CM | POA: Diagnosis not present

## 2020-09-15 ENCOUNTER — Ambulatory Visit: Payer: PPO | Admitting: Orthotics

## 2020-09-15 ENCOUNTER — Other Ambulatory Visit: Payer: Self-pay

## 2020-09-15 ENCOUNTER — Ambulatory Visit (INDEPENDENT_AMBULATORY_CARE_PROVIDER_SITE_OTHER): Payer: PPO | Admitting: Podiatry

## 2020-09-15 DIAGNOSIS — E1149 Type 2 diabetes mellitus with other diabetic neurological complication: Secondary | ICD-10-CM

## 2020-09-15 DIAGNOSIS — M216X9 Other acquired deformities of unspecified foot: Secondary | ICD-10-CM

## 2020-09-15 DIAGNOSIS — G629 Polyneuropathy, unspecified: Secondary | ICD-10-CM

## 2020-09-15 DIAGNOSIS — M722 Plantar fascial fibromatosis: Secondary | ICD-10-CM | POA: Diagnosis not present

## 2020-09-15 MED FILL — TADALAFIL 20 MG TABS: 20 | 30 days supply | Qty: 30 | Fill #0

## 2020-09-15 MED FILL — TADALAFIL 5 MG TABS: 5 | 90 days supply | Qty: 90 | Fill #1

## 2020-09-15 NOTE — Progress Notes (Signed)
Being seen by PA; however, [employer] has authorized shoes in past; cast today for 3 pair of inserts and patient p/out [id number]  As shoe choice.  DAWN to get [employer]

## 2020-09-21 DIAGNOSIS — G629 Polyneuropathy, unspecified: Secondary | ICD-10-CM | POA: Diagnosis not present

## 2020-09-21 DIAGNOSIS — M722 Plantar fascial fibromatosis: Secondary | ICD-10-CM | POA: Diagnosis not present

## 2020-09-21 DIAGNOSIS — E1149 Type 2 diabetes mellitus with other diabetic neurological complication: Secondary | ICD-10-CM | POA: Diagnosis not present

## 2020-09-21 MED ORDER — TRIAMCINOLONE ACETONIDE 10 MG/ML IJ SUSP
10.0000 mg | Freq: Once | INTRAMUSCULAR | Status: AC
  Administered 2020-09-21: 10 mg

## 2020-09-21 NOTE — Progress Notes (Signed)
Subjective: 64 year old male presents today for concerns of right heel pain requesting possible injection.  Also states that he has been getting numbness into his big toes but also into his hands.  Has not seen a neurologist recently.  He denies any recent weakness or falls.  He has no other concerns today. Denies any systemic complaints such as fevers, chills, nausea, vomiting. No acute changes since last appointment, and no other complaints at this time.   Objective: AAO x3, NAD DP/PT pulses palpable bilaterally, CRT less than 3 seconds Tenderness to palpation on plantar medial tubercle of the calcaneus at the insertion of plantar fascia on the right side.  Plantar fascia appears to be intact.  No pain with Achilles tendon.  No edema, erythema. Sensation decreased with Semmes Weinstein monofilament No pain with calf compression, swelling, warmth, erythema  Assessment: Right side plantar fasciitis, neuropathy  Plan: -All treatment options discussed with the patient including all alternatives, risks, complications.  -Steroid injection performed the right heel.  Continue supportive shoes and continue stretching exercises daily. -Refer to neurology given the numbness to his toes but also started in his hands. -Patient encouraged to call the office with any questions, concerns, change in symptoms.   Vivi Barrack DPM

## 2020-09-22 ENCOUNTER — Other Ambulatory Visit: Payer: Self-pay | Admitting: Podiatry

## 2020-09-22 ENCOUNTER — Encounter: Payer: Self-pay | Admitting: Neurology

## 2020-09-22 DIAGNOSIS — Z79899 Other long term (current) drug therapy: Secondary | ICD-10-CM | POA: Diagnosis not present

## 2020-09-22 DIAGNOSIS — E1149 Type 2 diabetes mellitus with other diabetic neurological complication: Secondary | ICD-10-CM

## 2020-09-22 NOTE — Progress Notes (Signed)
Faxed referral, demographics

## 2020-10-20 ENCOUNTER — Telehealth: Payer: Self-pay | Admitting: Podiatry

## 2020-10-20 NOTE — Telephone Encounter (Signed)
The representative from [employer] left message stating they needed the certifying statement from pcp to approve pts shoes. Fax records to [phone].  I looked in chart and found a md he seen in October and have faxed the paperwork to them. If they will not sign it I do not think they will approve the shoes.   I did call the representative back @ [employer] and explain that last year we did not have to have this for this pt but I have faxed paperwork to the md just waiting on it back

## 2020-10-24 ENCOUNTER — Telehealth: Payer: Self-pay | Admitting: Podiatry

## 2020-10-24 NOTE — Telephone Encounter (Signed)
Received voicemail from [facility] stating they are waiting on the letter of certification from his provider. I explained I had faxed it just waiting for them to fax it back. She recommended withdrawing the prior auth until we get that certification that way it does not get denied. I told her it was fine and I can just refax when I get the certification documents.

## 2020-10-27 DIAGNOSIS — R0600 Dyspnea, unspecified: Secondary | ICD-10-CM | POA: Diagnosis not present

## 2020-10-27 DIAGNOSIS — M25562 Pain in left knee: Secondary | ICD-10-CM | POA: Diagnosis not present

## 2020-10-27 DIAGNOSIS — I10 Essential (primary) hypertension: Secondary | ICD-10-CM | POA: Diagnosis not present

## 2020-10-27 DIAGNOSIS — E782 Mixed hyperlipidemia: Secondary | ICD-10-CM | POA: Diagnosis not present

## 2020-10-27 DIAGNOSIS — Z79899 Other long term (current) drug therapy: Secondary | ICD-10-CM | POA: Diagnosis not present

## 2020-10-27 DIAGNOSIS — E1165 Type 2 diabetes mellitus with hyperglycemia: Secondary | ICD-10-CM | POA: Diagnosis not present

## 2020-10-27 DIAGNOSIS — F419 Anxiety disorder, unspecified: Secondary | ICD-10-CM | POA: Diagnosis not present

## 2020-11-08 ENCOUNTER — Telehealth: Payer: Self-pay | Admitting: Podiatry

## 2020-11-08 NOTE — Telephone Encounter (Signed)
Pt left 2 messages this morning checking on diabetic shoes.   I returned call and left message stating that [employer] is requiring a letter of certification from the md/do that treats pts diabetes to approve the diabetic shoes/inserts.  I looked in chart and found a md/do that had seen in October and have faxed it to them multiple times. That is what is holding up the shoes.

## 2020-11-24 DIAGNOSIS — M25562 Pain in left knee: Secondary | ICD-10-CM | POA: Diagnosis not present

## 2020-11-24 DIAGNOSIS — E1165 Type 2 diabetes mellitus with hyperglycemia: Secondary | ICD-10-CM | POA: Diagnosis not present

## 2020-11-24 DIAGNOSIS — Z79899 Other long term (current) drug therapy: Secondary | ICD-10-CM | POA: Diagnosis not present

## 2020-11-24 DIAGNOSIS — I10 Essential (primary) hypertension: Secondary | ICD-10-CM | POA: Diagnosis not present

## 2020-11-24 DIAGNOSIS — F419 Anxiety disorder, unspecified: Secondary | ICD-10-CM | POA: Diagnosis not present

## 2020-11-24 DIAGNOSIS — Z6836 Body mass index (BMI) 36.0-36.9, adult: Secondary | ICD-10-CM | POA: Diagnosis not present

## 2020-12-12 ENCOUNTER — Encounter: Payer: Self-pay | Admitting: Neurology

## 2020-12-12 ENCOUNTER — Other Ambulatory Visit: Payer: Self-pay

## 2020-12-12 ENCOUNTER — Ambulatory Visit (INDEPENDENT_AMBULATORY_CARE_PROVIDER_SITE_OTHER): Payer: PPO | Admitting: Neurology

## 2020-12-12 ENCOUNTER — Other Ambulatory Visit (INDEPENDENT_AMBULATORY_CARE_PROVIDER_SITE_OTHER): Payer: PPO

## 2020-12-12 VITALS — BP 117/67 | HR 78 | Ht 68.0 in | Wt 242.0 lb

## 2020-12-12 DIAGNOSIS — Z9889 Other specified postprocedural states: Secondary | ICD-10-CM

## 2020-12-12 DIAGNOSIS — G621 Alcoholic polyneuropathy: Secondary | ICD-10-CM

## 2020-12-12 DIAGNOSIS — R202 Paresthesia of skin: Secondary | ICD-10-CM

## 2020-12-12 LAB — TSH: TSH: 1.44 u[IU]/mL (ref 0.35–4.50)

## 2020-12-12 LAB — B12 AND FOLATE PANEL
Folate: >23.6 ng/mL (ref >5.9)
Vitamin B-12: >1506 pg/mL — ABNORMAL HIGH (ref 211–911)

## 2020-12-12 NOTE — Patient Instructions (Addendum)
Nerve testing of the right arm and leg  Check labs  We will request your MRI cervical spine and lumbar spine reports

## 2020-12-12 NOTE — Progress Notes (Signed)
[facility] Clinic Note - Initial Visit   Date: 12/12/20  [name] MRN: [id number] DOB: 24-Feb-1956   Dear Otelia Limes. Lennice Sites, PA-C:  Thank you for your kind referral of Charles Wells for consultation of feet and thumb numbness. Although his history is well known to you, please allow [place] to reiterate it for the purpose of our medical record. The patient was accompanied to the clinic by son who also provides collateral information.     History of Present Illness: Charles Wells is a 65 y.o. right-handed male with well-controlled diabetes mellitus, depression, ED, hypertension, hyperlipidemia, GERD, and asthma/COPD presenting for evaluation of feet and thumb numbness.  Starting around 2021, he began having numbness/tingling in the toes, worse in the great toe.  Symptoms are intermittent, occurring about daily lasting 1 minute. No specific triggers.  It can occur at rest or standing.  Repositioning may help.  For the past two years, he has soreness of the thumbs and also numbness involving the hands.  He has difficulty sensing things in his hands and has burnt himself a few times holding a cigarette. Marland Kitchen    He had two cervical surgery.  He endorses chronic low back pain.  He takes gabapentin 361m three times daily and hydrocodone.   He used two cases of beer nightly x 8 years, and for the past 10 years has reduced his consumption to a few times per week.    Past Medical History:  Diagnosis Date  . Acid reflux   . Anxiety   . Asthma    daily inhalers  . BPH (benign prostatic hyperplasia)   . Cataract, immature   . COPD (chronic obstructive pulmonary disease) ([place]   . Degenerative disc disease, cervical   . Degenerative disc disease, lumbar   . Depression   . Deviated nasal septum   . Diabetes mellitus type 2, noninsulin dependent ([place]   . High cholesterol   . Hypertension    states under control with meds., has been on med. x "a while"  . Metatarsal  bone fracture [id number]  post-op complication right foot  . Prostate cancer ([facility]   . Sleep apnea    uses BiPAP nightly    Past Surgical History:  Procedure Laterality Date  . BONE RESECTION Right 07/26/2015   2nd/3rd metatarsal with plate fixation  . CERVICAL FUSION  2005  . HARDWARE REMOVAL Right 06/06/2016   Procedure: REMOVAL OF HARDWARE 2ND AND 3RD METATARSALS RIGHT FOOT ;  Surgeon: MTrula Slade DPM;  [facility]  Service: Podiatry;  Laterality: Right;  . ORIF TOE FRACTURE Right 06/06/2016   Procedure: ORIF 2ND AND 3RD METATARSAL, RIGHT FOOT;  Surgeon: MTrula Slade DPM;  [place] [facility]  Service: Podiatry;  Laterality: Right;  . PROSTATE BIOPSY  2014  . PROSTATE BIOPSY  2015  . PROSTATE BIOPSY  01/16/2018  . STERIOD INJECTION Right 06/06/2016   Procedure: STEROID INJECTION RIGHT HEEL;  Surgeon: MTrula Slade DPM;  [place] [facility]  Service: Podiatry;  Laterality: Right;     Medications:  Outpatient Encounter Medications as of 12/12/2020  Medication Sig  . albuterol (PROVENTIL HFA;VENTOLIN HFA) 108 (90 BASE) MCG/ACT inhaler Inhale 2 puffs into the lungs 4 (four) times daily.   .Marland KitchenALPRAZolam (XANAX) 0.5 MG tablet Take 0.1 mg by mouth 3 (three) times daily as needed for anxiety.  . ALPRAZolam (XANAX) 1 MG tablet TK 1 T PO  TID  . bacitracin ophthalmic ointment   . benzonatate (TESSALON) 200 MG capsule TK 1 C PO TID  . budesonide-formoterol (SYMBICORT) 160-4.5 MCG/ACT inhaler Inhale 2 puffs into the lungs 2 (two) times daily.  Marland Kitchen buPROPion (WELLBUTRIN SR) 150 MG 12 hr tablet Take 150 mg by mouth 2 (two) times daily.  . cetirizine (ZYRTEC) 10 MG tablet Take 10 mg by mouth at bedtime.  . chlorthalidone (HYGROTON) 25 MG tablet Take 25 mg by mouth daily.  . diclofenac sodium (VOLTAREN) 1 % GEL Apply topically 4 (four) times daily.  . fluticasone (FLONASE) 50 MCG/ACT nasal spray Place 1 spray into both  nostrils 2 (two) times daily.   Marland Kitchen gabapentin (NEURONTIN) 300 MG capsule Take 300 mg by mouth at bedtime.  Marland Kitchen HYDROcodone-acetaminophen (NORCO/VICODIN) 5-325 MG tablet Take 1 tablet by mouth every 6 (six) hours as needed for moderate pain.  Marland Kitchen ipratropium-albuterol (DUONEB) 0.5-2.5 (3) MG/3ML SOLN Take 3 mLs by nebulization every 6 (six) hours as needed.  Marland Kitchen losartan (COZAAR) 100 MG tablet Take 100 mg by mouth daily.  . magnesium oxide (MAG-OX) 400 MG tablet Take 400 mg by mouth daily.  Marland Kitchen MAGNESIUM-OXIDE 400 (241.3 Mg) MG tablet TK 1 T PO  D  . metFORMIN (GLUCOPHAGE) 500 MG tablet Take 500 mg by mouth daily with breakfast.  . miconazole (MICOTIN) 2 % cream Apply topically 2 (two) times daily.  . montelukast (SINGULAIR) 10 MG tablet Take 10 mg by mouth at bedtime.  Marland Kitchen NARCAN 4 MG/0.1ML LIQD nasal spray kit USE AS DIRECTED  . NONFORMULARY OR COMPOUNDED ITEM [facility]:  Peripheral Neuropathy Cream - Bupivacaine 1%, Doxepin 3%, Gabapentin 6%, Pentoxifylline 3%, Topiramate 1%, apply 1-2 grams to affected areas 3-4 x daily prn foot pain.  Marland Kitchen nystatin (NYSTATIN) powder Apply topically 4 (four) times daily.  Marland Kitchen omeprazole (PRILOSEC) 20 MG capsule Take 20 mg by mouth daily.  . phenazopyridine (PYRIDIUM) 100 MG tablet Take 1 tablet (100 mg total) by mouth 3 (three) times daily as needed for pain.  . potassium chloride SA (K-DUR,KLOR-CON) 20 MEQ tablet Take 40 mEq by mouth daily. Monday, Wednesday, Friday  . potassium chloride SA (K-DUR,KLOR-CON) 20 MEQ tablet Take 20 mEq by mouth daily. Tuesday, Thursday, Saturday, Sunday  . pravastatin (PRAVACHOL) 40 MG tablet Take 40 mg by mouth at bedtime.  . sertraline (ZOLOFT) 100 MG tablet Take 100 mg by mouth at bedtime.  . tadalafil (CIALIS) 20 MG tablet Take 20 mg by mouth daily as needed. Takes 5 mg every day  . tadalafil (CIALIS) 5 MG tablet Take 5 mg by mouth daily.  . tamsulosin (FLOMAX) 0.4 MG CAPS capsule Take 0.4 mg by mouth daily after breakfast.  .  tiZANidine (ZANAFLEX) 4 MG tablet TK 1 T PO TID  . Vitamin D, Ergocalciferol, (DRISDOL) 50000 units CAPS capsule Take 50,000 Units by mouth every 7 (seven) days.   Facility-Administered Encounter Medications as of 12/12/2020  Medication  . triamcinolone acetonide (KENALOG) 10 MG/ML injection 10 mg    Allergies:  Allergies  Allergen Reactions  . Shellfish Allergy     Family History: Family History  Problem Relation Age of Onset  . Depression Mother   . Hypertension Mother   . Heart disease Mother   . Arthritis Mother   . Stroke Father   . Depression Father   . Hypertension Father   . COPD Father   . Heart disease Father   . Arthritis Father   . Kidney disease Father   .  Lung cancer Father   . Skin cancer Father     Social History: Social History   Tobacco Use  . Smoking status: Former Smoker    Packs/day: 0.50    Years: 46.00    Pack years: 23.00    Types: Cigarettes    Quit date: 02/18/2018    Years since quitting: 2.8  . Smokeless tobacco: Never Used  . Tobacco comment: stopped smoking two weeks ago  Vaping Use  . Vaping Use: Never used  Substance Use Topics  . Alcohol use: Yes    Alcohol/week: 0.0 standard drinks    Comment: weekends  . Drug use: No   Social History   Social History Narrative   Right Handed   Lives in a one story home    Drinks little caffeine.       Quit smoking about 2 years ago    Vital Signs:  BP 117/67   Pulse 78   Ht 5' 8"  (1.727 m)   Wt 242 lb (109.8 kg)   SpO2 92%   BMI 36.80 kg/m   Neurological Exam: MENTAL STATUS including orientation to time, place, person, recent and remote memory, attention span and concentration, language, and fund of knowledge is normal.  Speech is not dysarthric.  CRANIAL NERVES: II:  No visual field defects.     III-IV-VI: Pupils equal round and reactive to light.  Normal conjugate, extra-ocular eye movements in all directions of gaze.  No nystagmus.  No ptosis.   V:  Normal facial  sensation.    VII:  Normal facial symmetry and movements.   VIII:  Normal hearing and vestibular function.   IX-X:  Normal palatal movement.   XI:  Normal shoulder shrug and head rotation.   XII:  Normal tongue strength and range of motion, no deviation or fasciculation.  MOTOR:  No atrophy, fasciculations or abnormal movements.  No pronator drift.   Upper Extremity:  Right  Left  Deltoid  5/5   5/5   Biceps  5/5   5/5   Triceps  5/5   5/5   Infraspinatus 5/5  5/5  Medial pectoralis 5/5  5/5  Wrist extensors  5/5   5/5   Wrist flexors  5/5   5/5   Finger extensors  5/5   5/5   Finger flexors  5/5   5/5   Dorsal interossei  5/5   5/5   Abductor pollicis  5/5   5/5   Tone (Ashworth scale)  0  0   Lower Extremity:  Right  Left  Hip flexors  5/5   5/5   Hip extensors  5/5   5/5   Adductor 5/5  5/5  Abductor 5/5  5/5  Knee flexors  5/5   5/5   Knee extensors  5/5   5/5   Dorsiflexors  5/5   5/5   Plantarflexors  5/5   5/5   Toe extensors  5/5   5/5   Toe flexors  5/5   5/5   Tone (Ashworth scale)  0  0   MSRs:  Right        Left                  brachioradialis 3+  3+  biceps 3+  3+  triceps 2+  2+  patellar 3+  3+  ankle jerk 1+  1+  Hoffman no  no  plantar response down  down   SENSORY:  Vibration, temperature,  and pin prick is mildly reduced in the feet, intact in the hands.  Rhomberg testing is mildly positive.   COORDINATION/GAIT: Normal finger-to- nose-finger. Intact rapid alternating movements bilaterally.  Gait narrow based and stable. Tandem and stressed gait intact.    IMPRESSION: Bilateral hand and feet paresthesias may be secondary to neuropathy, however with his brisk reflexes, and overlapping cervical canal stenosis/residual myelopathy is also likely.  I will request MRI reports of his cervical and lumbar spine.  Diabetes is very well-controlled (HbA1c 5.6). He does have history of alcohol dependency.   PLAN/RECOMMENDATIONS:  NCS/EMG of the right arm  and leg Check vitamin B12, folate, vitamin B1, TSH Outside records for MRI cervical spine and lumbar spine will be requested  Further recommendations pending results.    Thank you for allowing me to participate in patient's care.  If I can answer any additional questions, I would be pleased to do so.    Sincerely,    Miski Feldpausch K. Posey Pronto, DO

## 2020-12-13 ENCOUNTER — Telehealth: Payer: Self-pay

## 2020-12-13 NOTE — Telephone Encounter (Signed)
Can you call pt and find out the name of where he had his MRI c-spine and lumbar spine?

## 2020-12-13 NOTE — Telephone Encounter (Signed)
Received fax back from [facility] and was informed that They were unable to locate patient with name and DOB provided.

## 2020-12-14 ENCOUNTER — Telehealth: Payer: Self-pay | Admitting: Podiatry

## 2020-12-14 NOTE — Telephone Encounter (Signed)
Ok thank you 

## 2020-12-14 NOTE — Telephone Encounter (Signed)
Imaging is not available in epic and may have been performed elsewhere.

## 2020-12-14 NOTE — Telephone Encounter (Signed)
I have called the patient but no answer. Left VM

## 2020-12-14 NOTE — Telephone Encounter (Signed)
Pt called and lvm stating that he would like Dr. Jacqualyn Posey to give him call.

## 2020-12-14 NOTE — Telephone Encounter (Signed)
Pt left message stating we were to file for his diabetic shoes in December but we filed in January. And to please call him.  Before I could return his call he called back and said he called his insurance and they said we withdrew the auth in January for his diabetic shoes. I explained I did that because they were going to deny it because it needs the certifying physicians statement from the doctor that treats his diabetes. That is new this yr for [employer] but not new for medicare policy. He stated Isaias Cowman PA gives him his diabetic medicine and I told him it has to be an MD/DO and that I would call the office to see if I could find out who the overseeing md/do is to send the paperwork. I have discussed this with pt previously and left message with pt with this information.  When I called [facility] they gave me 2 different md/do's over Isaias Cowman PA.I have left message for pt to see if he knew which one it was because when I was telling him he said he knew the name but could not think of it.  I am just going to fax it to both doctors.

## 2020-12-14 NOTE — Telephone Encounter (Signed)
Patient returned call and informed me that either Dr. Louanne Skye or Dr.Wrenn ordered his MRI's. Patient stated they were done at [facility].Patient also informed me that Dr. Louanne Skye now works at [employer].

## 2020-12-14 NOTE — Telephone Encounter (Signed)
Called patient and left a message for a call back. Need to clarify where he had his MRI's done because [facility] cannot find him in the system.

## 2020-12-14 NOTE — Telephone Encounter (Signed)
Patient called and stated the he would like Dr. Jacqualyn Posey to give him a call.

## 2020-12-14 NOTE — Telephone Encounter (Signed)
Patient has requested return call-no details given, Please Advise

## 2020-12-15 NOTE — Telephone Encounter (Signed)
Do I still need to call him?

## 2020-12-19 ENCOUNTER — Other Ambulatory Visit (HOSPITAL_COMMUNITY): Payer: Self-pay | Admitting: Urology

## 2020-12-19 LAB — VITAMIN B1: Vitamin B1 (Thiamine): 11 nmol/L (ref 8–30)

## 2020-12-19 MED FILL — TADALAFIL 5 MG TABS: 5 | 90 days supply | Qty: 90 | Fill #0

## 2020-12-20 ENCOUNTER — Telehealth: Payer: Self-pay

## 2020-12-20 NOTE — Telephone Encounter (Signed)
Called patient and left a message for a call back.  

## 2020-12-20 NOTE — Telephone Encounter (Signed)
-----   Message from Alda Berthold, DO sent at 12/19/2020  1:38 PM EDT ----- Please notify patient lab are within normal limits.  Thank you.

## 2020-12-20 NOTE — Telephone Encounter (Signed)
Called patient and informed him of results. Patient is scheduled for an EMG on 01/03/21. Patient will wait until EMG is done to see what further recommendations Dr. Posey Pronto has for him. Patient had no further questions or concerns.

## 2020-12-26 DIAGNOSIS — I10 Essential (primary) hypertension: Secondary | ICD-10-CM | POA: Diagnosis not present

## 2020-12-26 DIAGNOSIS — E782 Mixed hyperlipidemia: Secondary | ICD-10-CM | POA: Diagnosis not present

## 2020-12-26 DIAGNOSIS — F419 Anxiety disorder, unspecified: Secondary | ICD-10-CM | POA: Diagnosis not present

## 2020-12-26 DIAGNOSIS — E1165 Type 2 diabetes mellitus with hyperglycemia: Secondary | ICD-10-CM | POA: Diagnosis not present

## 2020-12-26 DIAGNOSIS — M25562 Pain in left knee: Secondary | ICD-10-CM | POA: Diagnosis not present

## 2020-12-26 DIAGNOSIS — C61 Malignant neoplasm of prostate: Secondary | ICD-10-CM | POA: Diagnosis not present

## 2020-12-30 ENCOUNTER — Other Ambulatory Visit (HOSPITAL_COMMUNITY): Payer: Self-pay

## 2021-01-03 ENCOUNTER — Ambulatory Visit: Payer: PPO | Admitting: Neurology

## 2021-01-03 ENCOUNTER — Other Ambulatory Visit: Payer: Self-pay

## 2021-01-03 DIAGNOSIS — R202 Paresthesia of skin: Secondary | ICD-10-CM

## 2021-01-03 DIAGNOSIS — G5621 Lesion of ulnar nerve, right upper limb: Secondary | ICD-10-CM

## 2021-01-03 DIAGNOSIS — Z9889 Other specified postprocedural states: Secondary | ICD-10-CM

## 2021-01-03 DIAGNOSIS — G621 Alcoholic polyneuropathy: Secondary | ICD-10-CM

## 2021-01-03 NOTE — Procedures (Signed)
[facility]  [place], [place]  [place],  [address] Tel: [phone] Fax:  [phone] Test Date:  01/03/2021  Patient: Charles Wells DOB: 1956/08/21 Physician: Narda Amber, DO  Sex: Male Height: 5\' 8"  Ref Phys: Narda Amber, DO  ID#: [id number]   Technician:    Patient Complaints: This is a 65 year old man referred for evaluation of bilateral arm and leg paresthesias.  NCV & EMG Findings: Extensive electrodiagnostic testing of the right upper and lower extremity shows:  1. Right median and radial sensory responses show reduced amplitudes (R8.2, R9.4 V).  Right ulnar, sural, and superficial peroneal sensory responses are absent 2. Right median motor responses within normal limits.  Right ulnar motor response shows prolonged latency (3.5 ms), reduced amplitude (2.8 mV), and decreased conduction velocity (A Elbow-B Elbow, 42 m/s).  Right peroneal motor response is absent at the extensor digitorum brevis and normal at the tibialis anterior.  Right tibial motor response is reduced. 3. Right tibial H reflex study is prolonged. 4. Chronic motor axonal loss changes are seen affecting the right ulnar innervated muscles in the upper extremity.  In the right lower extremity, chronic motor axonal loss changes are seen affecting the muscles below the knee.  There is no evidence of accompanying active denervation.  Impression: 1. 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 2. 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   ___________________________ Narda Amber, DO    Nerve Conduction Studies Anti Sensory Summary Table   Stim Site NR Peak (ms) Norm Peak (ms) P-T Amp (V) Norm P-T Amp  Right Median Anti Sensory (2nd Digit)  Wrist    3.3 <3.8 8.2 >10  Right Radial  Anti Sensory (Base 1st Digit)  Wrist    2.5 <2.8 9.4 >10  Right Sup Peroneal Anti Sensory (Ant Lat Mall)  12 cm NR  <4.6  >3  Right Sural Anti Sensory (Lat Mall)  Calf NR  <4.6  >3  Right Ulnar Anti Sensory (5th Digit)  Wrist NR  <3.2  >5   Motor Summary Table   Stim Site NR Onset (ms) Norm Onset (ms) O-P Amp (mV) Norm O-P Amp Site1 Site2 Delta-0 (ms) Dist (cm) Vel (m/s) Norm Vel (m/s)  Right Median Motor (Abd Poll Brev)  Wrist    3.7 <4.0 6.0 >5 Elbow Wrist 5.2 28.0 54 >50  Elbow    8.9  4.5         Right Peroneal Motor (Ext Dig Brev)  Ankle NR  <6.0  >2.5 B Fib Ankle  0.0  >40  B Fib NR     Poplt B Fib  0.0  >40  Poplt NR            Right Peroneal TA Motor (Tib Ant)  Fib Head    2.6 <4.5 5.2 >3 Poplit Fib Head 1.5 9.0 60 >40  Poplit    4.1  5.1         Right Tibial Motor (Abd Hall Brev)  Ankle    5.0 <6.0 3.2 >4 Knee Ankle 8.8 42.0 48 >40  Knee    13.8  1.8         Right Ulnar Motor (Abd Dig Minimi)  Wrist    3.5 <3.1 2.8 >7 B Elbow Wrist 4.2 22.0 52 >50  B Elbow    7.7  1.5  A Elbow B Elbow 2.4 10.0 42 >50  A Elbow    10.1  1.3          H Reflex Studies   NR H-Lat (ms) Lat Norm (ms) L-R H-Lat (ms)  Right Tibial (Gastroc)     35.37 <35    EMG   Side Muscle Ins Act Fibs Psw Fasc Number Recrt Dur Dur. Amp Amp. Poly Poly. Comment  Right 1stDorInt Nml Nml Nml Nml 3- Rapid Many 1+ Many 1+ Some 1+ N/A  Right Abd Poll Brev Nml Nml Nml Nml Nml Nml Nml Nml Nml Nml Nml Nml N/A  Right Ext Indicis Nml Nml Nml Nml Nml Nml Nml Nml Nml Nml Nml Nml N/A  Right PronatorTeres Nml Nml Nml Nml Nml Nml Nml Nml Nml Nml Nml Nml N/A  Right Biceps Nml Nml Nml Nml Nml Nml Nml Nml Nml Nml Nml Nml N/A  Right Triceps Nml Nml Nml Nml Nml Nml Nml Nml Nml Nml Nml Nml N/A  Right Deltoid Nml Nml Nml Nml Nml Nml Nml Nml Nml Nml Nml Nml N/A  Right ABD Dig Min Nml Nml Nml Nml 3- Rapid Many 1+ Many 1+ Many 1+ N/A  Right FlexCarpiUln Nml Nml Nml Nml 2- Rapid Some 1+ Some 1+ Some 1+ N/A  Right AntTibialis  Nml Nml Nml Nml 2- Rapid Many 1+ Some 1+ Some 1+ N/A  Right Gastroc Nml Nml Nml Nml 1- Rapid Some 1+ Some 1+ Some 1+ N/A  Right Flex Dig Long Nml Nml Nml Nml 2- Rapid Many 1+ Many 1+ Some 1+ N/A  Right RectFemoris Nml Nml Nml Nml Nml Nml Nml Nml Nml Nml Nml Nml N/A  Right GluteusMed Nml Nml Nml Nml Nml Nml Nml Nml Nml Nml Nml Nml N/A  Right BicepsFemS Nml Nml Nml Nml Nml Nml Nml Nml Nml Nml Nml Nml N/A      Waveforms:

## 2021-01-04 ENCOUNTER — Telehealth: Payer: Self-pay | Admitting: Neurology

## 2021-01-04 NOTE — Telephone Encounter (Signed)
Patient informed that his EMG shows severe right ulnar neuropathy and sensorimotor polyneuropathy affecting a stocking-glove distribution.  He is not interested in surgery for ulnar decompression, but will let the office know if symptoms get worse.  Regarding his neuropathy, patient advised to try to cut back on alcohol, which he is doing, and keep his diabetes well-controlled.   Charles Halm K. Posey Pronto, DO

## 2021-01-31 DIAGNOSIS — M25562 Pain in left knee: Secondary | ICD-10-CM | POA: Diagnosis not present

## 2021-01-31 DIAGNOSIS — C61 Malignant neoplasm of prostate: Secondary | ICD-10-CM | POA: Diagnosis not present

## 2021-01-31 DIAGNOSIS — E1165 Type 2 diabetes mellitus with hyperglycemia: Secondary | ICD-10-CM | POA: Diagnosis not present

## 2021-01-31 DIAGNOSIS — F419 Anxiety disorder, unspecified: Secondary | ICD-10-CM | POA: Diagnosis not present

## 2021-01-31 DIAGNOSIS — E782 Mixed hyperlipidemia: Secondary | ICD-10-CM | POA: Diagnosis not present

## 2021-01-31 DIAGNOSIS — Z79899 Other long term (current) drug therapy: Secondary | ICD-10-CM | POA: Diagnosis not present

## 2021-01-31 DIAGNOSIS — I10 Essential (primary) hypertension: Secondary | ICD-10-CM | POA: Diagnosis not present

## 2021-03-03 DIAGNOSIS — Z0000 Encounter for general adult medical examination without abnormal findings: Secondary | ICD-10-CM | POA: Diagnosis not present

## 2021-03-03 DIAGNOSIS — C61 Malignant neoplasm of prostate: Secondary | ICD-10-CM | POA: Diagnosis not present

## 2021-03-03 DIAGNOSIS — E782 Mixed hyperlipidemia: Secondary | ICD-10-CM | POA: Diagnosis not present

## 2021-03-03 DIAGNOSIS — Z79899 Other long term (current) drug therapy: Secondary | ICD-10-CM | POA: Diagnosis not present

## 2021-03-03 DIAGNOSIS — M79645 Pain in left finger(s): Secondary | ICD-10-CM | POA: Diagnosis not present

## 2021-03-03 DIAGNOSIS — E1165 Type 2 diabetes mellitus with hyperglycemia: Secondary | ICD-10-CM | POA: Diagnosis not present

## 2021-03-03 DIAGNOSIS — M25562 Pain in left knee: Secondary | ICD-10-CM | POA: Diagnosis not present

## 2021-03-03 DIAGNOSIS — I10 Essential (primary) hypertension: Secondary | ICD-10-CM | POA: Diagnosis not present

## 2021-03-03 DIAGNOSIS — F419 Anxiety disorder, unspecified: Secondary | ICD-10-CM | POA: Diagnosis not present

## 2021-03-15 ENCOUNTER — Other Ambulatory Visit: Payer: Self-pay

## 2021-03-15 ENCOUNTER — Ambulatory Visit (INDEPENDENT_AMBULATORY_CARE_PROVIDER_SITE_OTHER): Payer: PPO | Admitting: Podiatry

## 2021-03-15 DIAGNOSIS — M216X9 Other acquired deformities of unspecified foot: Secondary | ICD-10-CM

## 2021-03-15 DIAGNOSIS — M2041 Other hammer toe(s) (acquired), right foot: Secondary | ICD-10-CM

## 2021-03-15 DIAGNOSIS — M722 Plantar fascial fibromatosis: Secondary | ICD-10-CM

## 2021-03-15 DIAGNOSIS — E1149 Type 2 diabetes mellitus with other diabetic neurological complication: Secondary | ICD-10-CM

## 2021-03-15 DIAGNOSIS — E1140 Type 2 diabetes mellitus with diabetic neuropathy, unspecified: Secondary | ICD-10-CM | POA: Diagnosis not present

## 2021-03-15 DIAGNOSIS — M2042 Other hammer toe(s) (acquired), left foot: Secondary | ICD-10-CM

## 2021-03-15 NOTE — Progress Notes (Signed)
The patient presented to the office today to pick up diabetic shoes and 3 pair diabetic custom inserts.  1 pair of inserts were put in the shoes and the shoes were fitted to the patient. The patient states they are comfortable and free of defect. He is satisfied with the fit of the shoe. Instructions for break in and wear were dispensed.  The patient signed the delivery documentation and break in instruction form.  If any concerns or questions arise, he is instructed to call

## 2021-04-04 DIAGNOSIS — C61 Malignant neoplasm of prostate: Secondary | ICD-10-CM | POA: Diagnosis not present

## 2021-04-04 DIAGNOSIS — E782 Mixed hyperlipidemia: Secondary | ICD-10-CM | POA: Diagnosis not present

## 2021-04-04 DIAGNOSIS — F419 Anxiety disorder, unspecified: Secondary | ICD-10-CM | POA: Diagnosis not present

## 2021-04-04 DIAGNOSIS — M25562 Pain in left knee: Secondary | ICD-10-CM | POA: Diagnosis not present

## 2021-04-07 ENCOUNTER — Other Ambulatory Visit (HOSPITAL_COMMUNITY): Payer: Self-pay

## 2021-04-07 ENCOUNTER — Telehealth: Payer: Self-pay | Admitting: *Deleted

## 2021-04-07 ENCOUNTER — Other Ambulatory Visit: Payer: Self-pay | Admitting: Urology

## 2021-04-07 MED ORDER — TADALAFIL 5 MG PO TABS
ORAL_TABLET | Freq: Every day | ORAL | 0 refills | Status: DC
  Filled 2021-04-07: qty 90, 90d supply, fill #0

## 2021-04-07 NOTE — Telephone Encounter (Signed)
Called and left a message for the patient per Dr Jacqualyn Posey. [name]

## 2021-04-07 NOTE — Telephone Encounter (Signed)
Message complete

## 2021-04-07 NOTE — Telephone Encounter (Signed)
Patient is calling for an appointment to get another injection for his right foot plantar fasciitis. He has been scheduled for 04/11/21  He is also having pain that feels like blisters on bottom of feet , has been diagnosed w/ Neuropathy, wants to know if there is anything that can be rubbed on feet for relief. Please advise.

## 2021-04-10 DIAGNOSIS — Z8546 Personal history of malignant neoplasm of prostate: Secondary | ICD-10-CM | POA: Diagnosis not present

## 2021-04-11 ENCOUNTER — Ambulatory Visit: Payer: PPO | Admitting: Podiatry

## 2021-04-11 ENCOUNTER — Encounter: Payer: Self-pay | Admitting: Podiatry

## 2021-04-11 ENCOUNTER — Other Ambulatory Visit: Payer: Self-pay

## 2021-04-11 DIAGNOSIS — M25562 Pain in left knee: Secondary | ICD-10-CM

## 2021-04-11 DIAGNOSIS — G629 Polyneuropathy, unspecified: Secondary | ICD-10-CM | POA: Diagnosis not present

## 2021-04-11 DIAGNOSIS — M25561 Pain in right knee: Secondary | ICD-10-CM

## 2021-04-11 DIAGNOSIS — E1149 Type 2 diabetes mellitus with other diabetic neurological complication: Secondary | ICD-10-CM

## 2021-04-15 NOTE — Progress Notes (Signed)
Subjective: 65 year old male presents the office today for concerns of a sensation of feeling blisters under the ball of his feet.  He has not seen actual blisters or open sores.  He is on gabapentin for neuropathy.  She has been going on for a while but seems to be getting worse.  He is previously been seen by neurology as well. Denies any systemic complaints such as fevers, chills, nausea, vomiting. No acute changes since last appointment, and no other complaints at this time.   He has been having hand pain as well as left knee pain.  Objective: AAO x3, NAD DP/PT pulses palpable bilaterally, CRT less than 3 seconds Sensation decreased with Semmes Weinstein monofilament. There are no open lesions or blister formation present.  There is no edema, erythema. No pain with calf compression, swelling, warmth, erythema  Assessment: 65 year old male with neuropathy  Plan: -All treatment options discussed with the patient including all alternatives, risks, complications.  -He is already on gabapentin.  He is also previously followed up with Dr. Posey Pronto with neurology.  Discussed considering changing his dose of gabapentin. -We will place referral to [facility] at his request for left knee pain. -Patient encouraged to call the office with any questions, concerns, change in symptoms.   Trula Slade DPM

## 2021-04-19 ENCOUNTER — Telehealth: Payer: Self-pay | Admitting: *Deleted

## 2021-04-19 NOTE — Telephone Encounter (Signed)
Faxed over a referral to [facility] the patient for knee pain and the fax number is [phone]. [name]

## 2021-04-19 NOTE — Addendum Note (Signed)
Addended by: Cranford Mon R on: 04/19/2021 01:22 PM   Modules accepted: Orders

## 2021-04-19 NOTE — Telephone Encounter (Signed)
-----   Message from Trula Slade, DPM sent at 04/15/2021  9:07 AM EDT ----- Can you please put a referral in for [facility] for left knee pain?  Thank you.

## 2021-04-25 ENCOUNTER — Other Ambulatory Visit (HOSPITAL_COMMUNITY): Payer: Self-pay

## 2021-04-25 DIAGNOSIS — N401 Enlarged prostate with lower urinary tract symptoms: Secondary | ICD-10-CM | POA: Diagnosis not present

## 2021-04-25 DIAGNOSIS — R351 Nocturia: Secondary | ICD-10-CM | POA: Diagnosis not present

## 2021-04-25 DIAGNOSIS — Z8546 Personal history of malignant neoplasm of prostate: Secondary | ICD-10-CM | POA: Diagnosis not present

## 2021-04-25 DIAGNOSIS — N5201 Erectile dysfunction due to arterial insufficiency: Secondary | ICD-10-CM | POA: Diagnosis not present

## 2021-04-25 MED ORDER — TAMSULOSIN HCL 0.4 MG PO CAPS
ORAL_CAPSULE | ORAL | 3 refills | Status: AC
  Filled 2021-04-25: qty 90, 90d supply, fill #0
  Filled 2021-12-04: qty 90, 90d supply, fill #1

## 2021-04-25 MED ORDER — TADALAFIL 5 MG PO TABS
ORAL_TABLET | ORAL | 1 refills | Status: DC
  Filled 2021-04-25: qty 90, 90d supply, fill #0
  Filled 2021-08-07: qty 90, 90d supply, fill #1

## 2021-05-01 DIAGNOSIS — M1712 Unilateral primary osteoarthritis, left knee: Secondary | ICD-10-CM | POA: Diagnosis not present

## 2021-05-02 ENCOUNTER — Other Ambulatory Visit (HOSPITAL_COMMUNITY): Payer: Self-pay

## 2021-05-05 DIAGNOSIS — Z79899 Other long term (current) drug therapy: Secondary | ICD-10-CM | POA: Diagnosis not present

## 2021-05-05 DIAGNOSIS — C61 Malignant neoplasm of prostate: Secondary | ICD-10-CM | POA: Diagnosis not present

## 2021-05-05 DIAGNOSIS — F419 Anxiety disorder, unspecified: Secondary | ICD-10-CM | POA: Diagnosis not present

## 2021-05-05 DIAGNOSIS — E782 Mixed hyperlipidemia: Secondary | ICD-10-CM | POA: Diagnosis not present

## 2021-05-05 DIAGNOSIS — M25562 Pain in left knee: Secondary | ICD-10-CM | POA: Diagnosis not present

## 2021-06-05 DIAGNOSIS — M25562 Pain in left knee: Secondary | ICD-10-CM | POA: Diagnosis not present

## 2021-06-05 DIAGNOSIS — C61 Malignant neoplasm of prostate: Secondary | ICD-10-CM | POA: Diagnosis not present

## 2021-06-05 DIAGNOSIS — F419 Anxiety disorder, unspecified: Secondary | ICD-10-CM | POA: Diagnosis not present

## 2021-06-05 DIAGNOSIS — E782 Mixed hyperlipidemia: Secondary | ICD-10-CM | POA: Diagnosis not present

## 2021-06-07 ENCOUNTER — Telehealth: Payer: Self-pay | Admitting: Podiatry

## 2021-06-07 NOTE — Telephone Encounter (Signed)
Patient called the office stating his liver doctor change his medication from gabapentin since it was not working or him . He wants to know if he should still come to his appointment that is on 06/12/21

## 2021-06-07 NOTE — Telephone Encounter (Signed)
I called the patient to let him know if he wasn't having any other issues then he can follow up with [place] in 3 months.

## 2021-06-12 ENCOUNTER — Ambulatory Visit: Payer: PPO | Admitting: Podiatry

## 2021-07-24 ENCOUNTER — Telehealth: Payer: Self-pay | Admitting: Podiatry

## 2021-07-24 NOTE — Telephone Encounter (Signed)
Patient called and stated his right 2nd toe is very painful, requested to see Dr. Jacqualyn Posey today but there is no availability. Offered patient to see another provider but refused. Also offered for patient to be seen tomorrow at 11:00am. Patient would like a call from Dr. Jacqualyn Posey.

## 2021-07-25 ENCOUNTER — Other Ambulatory Visit: Payer: Self-pay

## 2021-07-25 ENCOUNTER — Encounter: Payer: Self-pay | Admitting: Podiatry

## 2021-07-25 ENCOUNTER — Ambulatory Visit (INDEPENDENT_AMBULATORY_CARE_PROVIDER_SITE_OTHER): Payer: PPO

## 2021-07-25 ENCOUNTER — Ambulatory Visit: Payer: PPO | Admitting: Podiatry

## 2021-07-25 DIAGNOSIS — M2041 Other hammer toe(s) (acquired), right foot: Secondary | ICD-10-CM

## 2021-07-25 DIAGNOSIS — M722 Plantar fascial fibromatosis: Secondary | ICD-10-CM

## 2021-07-25 DIAGNOSIS — M7751 Other enthesopathy of right foot: Secondary | ICD-10-CM | POA: Diagnosis not present

## 2021-07-25 DIAGNOSIS — M79671 Pain in right foot: Secondary | ICD-10-CM | POA: Diagnosis not present

## 2021-07-25 MED ORDER — TRIAMCINOLONE ACETONIDE 10 MG/ML IJ SUSP
10.0000 mg | Freq: Once | INTRAMUSCULAR | Status: AC
  Administered 2021-07-25: 10 mg

## 2021-07-27 NOTE — Progress Notes (Signed)
Subjective: 65 year old male presents the office today for concerns of pain to his right foot point on second MPJ.  Also asking for steroid injection to his heel as its becoming more painful as well.  He denies any recent injury or trauma.  He said no recent treatment for his other issues since I last saw him.  He has no other concerns.  Objective: AAO x3, NAD DP/PT pulses palpable bilaterally, CRT less than 3 seconds Sensation decreased with Semmes Weinstein monofilament. Tense palpation on plantar medial tubercle of calcaneus at insertion of plantar fashion to the plantar pressure appears to be intact.  No pain about compression calcaneus.  There is tenderness on the right second MPJ there is localized edema.  There is no pain with MPJ range of motion.  No significant area of pinpoint tenderness. No pain with calf compression, swelling, warmth, erythema  Assessment: 65 year old male with plantar fasciitis right foot, capsulitis second MPJ  Plan: -All treatment options discussed with the patient including all alternatives, risks, complications.  -X-rays obtained reviewed.  No evidence of acute fracture noted.  Hardware intact without any displacement.  Old broken hardware is also noted but not changed. -Steroid injection for the right plantar fascial.  See procedure note below.  Dispensed surgical shoe given the pain in the foot as well.  Encouraged compression, elevation.  Procedure: Injection Tendon/Ligament Discussed alternatives, risks, complications and verbal consent was obtained.  Location: Right plantar fascia at the glabrous junction; medial approach. Skin Prep: Alcohol. Injectate: 0.5cc 0.5% marcaine plain, 0.5 cc 2% lidocaine plain and, 1 cc kenalog 10. Disposition: Patient tolerated procedure well. Injection site dressed with a band-aid.  Post-injection care was discussed and return precautions discussed.    Return in about 4 weeks (around 08/22/2021).  Trula Slade  DPM

## 2021-08-07 ENCOUNTER — Other Ambulatory Visit (HOSPITAL_COMMUNITY): Payer: Self-pay

## 2021-08-07 DIAGNOSIS — C61 Malignant neoplasm of prostate: Secondary | ICD-10-CM | POA: Diagnosis not present

## 2021-08-07 DIAGNOSIS — Z79899 Other long term (current) drug therapy: Secondary | ICD-10-CM | POA: Diagnosis not present

## 2021-08-07 DIAGNOSIS — G629 Polyneuropathy, unspecified: Secondary | ICD-10-CM | POA: Diagnosis not present

## 2021-08-07 DIAGNOSIS — R0609 Other forms of dyspnea: Secondary | ICD-10-CM | POA: Diagnosis not present

## 2021-08-07 DIAGNOSIS — F419 Anxiety disorder, unspecified: Secondary | ICD-10-CM | POA: Diagnosis not present

## 2021-08-07 DIAGNOSIS — I10 Essential (primary) hypertension: Secondary | ICD-10-CM | POA: Diagnosis not present

## 2021-08-07 DIAGNOSIS — M25562 Pain in left knee: Secondary | ICD-10-CM | POA: Diagnosis not present

## 2021-08-07 DIAGNOSIS — R7302 Impaired glucose tolerance (oral): Secondary | ICD-10-CM | POA: Diagnosis not present

## 2021-08-07 DIAGNOSIS — E782 Mixed hyperlipidemia: Secondary | ICD-10-CM | POA: Diagnosis not present

## 2021-09-06 DIAGNOSIS — F419 Anxiety disorder, unspecified: Secondary | ICD-10-CM | POA: Diagnosis not present

## 2021-09-06 DIAGNOSIS — Z79899 Other long term (current) drug therapy: Secondary | ICD-10-CM | POA: Diagnosis not present

## 2021-09-06 DIAGNOSIS — C61 Malignant neoplasm of prostate: Secondary | ICD-10-CM | POA: Diagnosis not present

## 2021-09-06 DIAGNOSIS — I10 Essential (primary) hypertension: Secondary | ICD-10-CM | POA: Diagnosis not present

## 2021-09-06 DIAGNOSIS — G629 Polyneuropathy, unspecified: Secondary | ICD-10-CM | POA: Diagnosis not present

## 2021-09-06 DIAGNOSIS — M25562 Pain in left knee: Secondary | ICD-10-CM | POA: Diagnosis not present

## 2021-11-29 ENCOUNTER — Other Ambulatory Visit (HOSPITAL_COMMUNITY): Payer: Self-pay

## 2021-11-29 MED ORDER — TADALAFIL 5 MG PO TABS
ORAL_TABLET | ORAL | 1 refills | Status: AC
  Filled 2021-11-29: qty 30, 30d supply, fill #0

## 2021-11-29 MED ORDER — TADALAFIL 5 MG PO TABS
5.0000 mg | ORAL_TABLET | Freq: Every morning | ORAL | 1 refills | Status: AC
  Filled 2021-11-29: qty 90, 90d supply, fill #0

## 2021-12-04 ENCOUNTER — Other Ambulatory Visit (HOSPITAL_COMMUNITY): Payer: Self-pay

## 2022-01-15 ENCOUNTER — Other Ambulatory Visit (HOSPITAL_COMMUNITY): Payer: Self-pay

## 2022-01-15 MED ORDER — TADALAFIL 5 MG PO TABS
ORAL_TABLET | ORAL | 1 refills | Status: AC
  Filled 2022-01-15: qty 90, 90d supply, fill #0

## 2022-01-15 MED ORDER — TADALAFIL (PAH) 20 MG PO TABS
ORAL_TABLET | ORAL | 5 refills | Status: AC
  Filled 2022-01-15: qty 30, 30d supply, fill #0

## 2022-01-15 MED ORDER — TAMSULOSIN HCL 0.4 MG PO CAPS
0.4000 mg | ORAL_CAPSULE | Freq: Every day | ORAL | 3 refills | Status: DC
  Filled 2022-01-15 – 2022-12-14 (×2): qty 90, 90d supply, fill #0

## 2022-01-18 ENCOUNTER — Ambulatory Visit: Payer: PPO | Admitting: Sports Medicine

## 2022-01-18 DIAGNOSIS — M79674 Pain in right toe(s): Secondary | ICD-10-CM | POA: Diagnosis not present

## 2022-01-18 DIAGNOSIS — M79675 Pain in left toe(s): Secondary | ICD-10-CM | POA: Diagnosis not present

## 2022-01-18 DIAGNOSIS — G629 Polyneuropathy, unspecified: Secondary | ICD-10-CM | POA: Diagnosis not present

## 2022-01-18 DIAGNOSIS — E1149 Type 2 diabetes mellitus with other diabetic neurological complication: Secondary | ICD-10-CM | POA: Diagnosis not present

## 2022-01-18 MED ORDER — AMOXICILLIN-POT CLAVULANATE 875-125 MG PO TABS: 1.0000 | ORAL_TABLET | Freq: Two times a day (BID) | ORAL | 0 refills | Status: AC

## 2022-01-18 MED ORDER — NEOMYCIN-POLYMYXIN-HC 3.5-10000-1 OT SOLN: OTIC | 0 refills | Status: AC

## 2022-01-18 NOTE — Progress Notes (Signed)
Subjective: ?Charles Wells is a 66 y.o. male patient with history of diabetes who presents to office today for toe evaluation states that he cut himself while trying to dig out an ingrown toenail on the left great toe and states that he has been cleaning it with peroxide and also did the same thing on the right great toe but the right great toe is not hurting like the left.  Patient denies any constitutional symptoms at this time but states that he became concerned because of the pain and bleeding from the left. ? ?Patient Active Problem List  ? Diagnosis Date Noted  ? Pain in the groin, right 08/15/2018  ? Malignant neoplasm of prostate ([place]) 03/04/2018  ? Plantar fasciitis, right   ? Emphysema of lung ([place])   ? Acid reflux   ? Hypertension   ? COPD (chronic obstructive pulmonary disease) ([place])   ? Asthma   ? Sleep apnea   ? Anxiety   ? Rib pain on right side 09/30/2013  ? ?Current Outpatient Medications on File Prior to Visit  ?Medication Sig Dispense Refill  ? albuterol (PROVENTIL HFA;VENTOLIN HFA) 108 (90 BASE) MCG/ACT inhaler Inhale 2 puffs into the lungs 4 (four) times daily.    ? ALPRAZolam (XANAX) 0.5 MG tablet Take 0.1 mg by mouth 3 (three) times daily as needed for anxiety. (Patient not taking: Reported on 12/12/2020)    ? ALPRAZolam (XANAX) 1 MG tablet TK 1 T PO TID    ? bacitracin ophthalmic ointment  (Patient not taking: Reported on 12/12/2020)    ? benzonatate (TESSALON) 200 MG capsule TK 1 C PO TID (Patient not taking: Reported on 12/12/2020)  0  ? budesonide-formoterol (SYMBICORT) 160-4.5 MCG/ACT inhaler Inhale 2 puffs into the lungs 2 (two) times daily.    ? buPROPion (WELLBUTRIN SR) 150 MG 12 hr tablet Take 150 mg by mouth 2 (two) times daily.    ? cetirizine (ZYRTEC) 10 MG tablet Take 10 mg by mouth at bedtime.    ? chlorthalidone (HYGROTON) 25 MG tablet Take 25 mg by mouth daily.    ? Cyanocobalamin (B-12) 5000 MCG CAPS Take by mouth at bedtime.    ? cyclobenzaprine (FLEXERIL) 10 MG tablet Take 10  mg by mouth 3 (three) times daily as needed for muscle spasms.    ? diclofenac sodium (VOLTAREN) 1 % GEL Apply topically 4 (four) times daily. (Patient not taking: Reported on 12/12/2020)    ? fluticasone (FLONASE) 50 MCG/ACT nasal spray Place 1 spray into both nostrils 2 (two) times daily.    ? gabapentin (NEURONTIN) 300 MG capsule Take 300 mg by mouth 3 (three) times daily. Take one capsule 3 times daily    ? HYDROcodone-acetaminophen (NORCO) 10-325 MG tablet Take 1 tablet by mouth every 6 (six) hours as needed for moderate pain.    ? ipratropium-albuterol (DUONEB) 0.5-2.5 (3) MG/3ML SOLN Take 3 mLs by nebulization every 6 (six) hours as needed.    ? levocetirizine (XYZAL) 5 MG tablet Take 5 mg by mouth daily.    ? losartan (COZAAR) 100 MG tablet Take 100 mg by mouth daily.    ? magnesium oxide (MAG-OX) 400 (240 Mg) MG tablet Take 1 tablet by mouth daily.    ? magnesium oxide (MAG-OX) 400 MG tablet Take 400 mg by mouth daily. (Patient not taking: Reported on 12/12/2020)    ? MAGNESIUM-OXIDE 400 (241.3 Mg) MG tablet TK 1 T PO  D    ? metFORMIN (GLUCOPHAGE) 500 MG tablet  Take 500 mg by mouth daily with breakfast.    ? miconazole (MICOTIN) 2 % cream Apply topically 2 (two) times daily.    ? montelukast (SINGULAIR) 10 MG tablet Take 10 mg by mouth at bedtime.    ? NARCAN 4 MG/0.1ML LIQD nasal spray kit USE AS DIRECTED  0  ? NONFORMULARY OR COMPOUNDED ITEM [facility]:  Peripheral Neuropathy Cream - Bupivacaine 1%, Doxepin 3%, Gabapentin 6%, Pentoxifylline 3%, Topiramate 1%, apply 1-2 grams to affected areas 3-4 x daily prn foot pain. 100 each 5  ? nystatin (MYCOSTATIN) 100000 UNIT/ML suspension Take 5 mLs by mouth 4 (four) times daily.    ? nystatin (MYCOSTATIN/NYSTOP) powder Apply topically 4 (four) times daily.    ? omeprazole (PRILOSEC) 20 MG capsule Take 20 mg by mouth daily.    ? phenazopyridine (PYRIDIUM) 100 MG tablet Take 1 tablet (100 mg total) by mouth 3 (three) times daily as needed for pain. (Patient  not taking: Reported on 12/12/2020) 90 tablet 0  ? potassium chloride SA (K-DUR,KLOR-CON) 20 MEQ tablet Take 40 mEq by mouth daily. Monday, Wednesday, Friday (Patient not taking: Reported on 12/12/2020)    ? potassium chloride SA (K-DUR,KLOR-CON) 20 MEQ tablet Take 20 mEq by mouth daily. Tuesday, Thursday, Saturday, Sunday    ? pravastatin (PRAVACHOL) 40 MG tablet Take 40 mg by mouth at bedtime.    ? predniSONE (STERAPRED UNI-PAK 21 TAB) 10 MG (21) TBPK tablet See admin instructions. follow package directions    ? pregabalin (LYRICA) 50 MG capsule Take 50 mg by mouth 3 (three) times daily.    ? sertraline (ZOLOFT) 100 MG tablet Take 100 mg by mouth at bedtime.    ? tadalafil (CIALIS) 20 MG tablet Take 20 mg by mouth daily as needed. Takes 5 mg every day    ? tadalafil (CIALIS) 5 MG tablet Take 5 mg by mouth daily.    ? tadalafil (CIALIS) 5 MG tablet Take 1 tablet (5 mg total) by mouth every morning. 90 tablet 1  ? tadalafil (CIALIS) 5 MG tablet Take 1 tablet by mouth every morning 90 tablet 1  ? tadalafil (CIALIS) 5 MG tablet Take 1 tablet by mouth in the morning 90 tablet 1  ? tadalafil, PAH, (ADCIRCA) 20 MG tablet Take 1 tablet by mouth daily **don't take with the 5 mg the same day. 30 tablet 5  ? tamsulosin (FLOMAX) 0.4 MG CAPS capsule Take 0.4 mg by mouth daily after breakfast.    ? tamsulosin (FLOMAX) 0.4 MG CAPS capsule Take 1 capsule by mouth once a day 90 capsule 3  ? tamsulosin (FLOMAX) 0.4 MG CAPS capsule Take 1 capsule by mouth daily 90 capsule 3  ? terbinafine (LAMISIL) 250 MG tablet Take 250 mg by mouth daily.    ? tiZANidine (ZANAFLEX) 4 MG tablet TK 1 T PO TID  0  ? Vitamin D, Ergocalciferol, (DRISDOL) 50000 units CAPS capsule Take 50,000 Units by mouth every 7 (seven) days.    ? ?Current Facility-Administered Medications on File Prior to Visit  ?Medication Dose Route Frequency Provider Last Rate Last Admin  ? triamcinolone acetonide (KENALOG) 10 MG/ML injection 10 mg  10 mg Other Once Trula Slade, DPM      ? ?Allergies  ?Allergen Reactions  ? Shellfish Allergy   ? ? ?No results found for this or any previous visit (from the past 2160 hour(s)). ? ?Objective: ?General: Patient is awake, alert, and oriented x 3 and in no acute distress. ? ?  Integument: Skin is warm, dry and supple bilateral.  Minimal fragments of nail at bilateral hallux from patient cutting nail release shoulder there is small pieces of nail at both the medial and lateral corners of the left hallux from patient improperly trimming his nail there is dried blood to the nailbed and blanchable erythema on the left, no other acute findings and no other open lesions. ? ?Vasculature:  Dorsalis Pedis pulse 1/4 bilateral. Posterior Tibial pulse 1/4 bilateral.  ?Capillary fill time <3 sec 1-5 bilateral. Positive hair growth to the level of the digits. ?Temperature gradient within normal limits. No varicosities present bilateral. No edema present bilateral.  ? ?Neurology: Vibratory sensation diminished bilateral however patient does have Gross pedal sensation to light touch.  ? ?Musculoskeletal: Pain to palpation to left greater than right first toe. ? ?Assessment and Plan: ?Problem List Items Addressed This Visit   ?None ?Visit Diagnoses   ? ? Pain around toenail, left foot    -  Primary  ? Pain around toenail, right foot      ? Type II diabetes mellitus with neurological manifestations ([place])      ? Neuropathy      ? ?  ? ? ?-Examined patient. ?-Discussed the importance of patient not trimming his toenails too short and trying not to digging out any ingrown portions on his own because of risk of infection ?-To patient's tolerance mechanically debrided bilateral hallux nails removing any offending borders at the first toes bilateral and applied antibiotic cream and Band-Aid.  Prescribed Corticosporin solution and advised patient to stop using peroxide and to soak with Epsom salt and warm water carefully and then after use Corticosporin and redress as  needed with a Band-Aid ?-For precautionary measures I also prescribed Augmentin antibiotic for patient to take as well ?-Answered all patient questions ?-Patient to return as needed or sooner if toes fe

## 2022-02-01 ENCOUNTER — Telehealth: Payer: Self-pay | Admitting: *Deleted

## 2022-02-01 NOTE — Telephone Encounter (Signed)
Patient has been notified, verbalized understanding.

## 2022-02-01 NOTE — Telephone Encounter (Signed)
Patient is calling for a refill of pregabalin ,and some [place] for pain,his PCP is out of the office and he is in pain because he recently had all his bottom teeth removed and having a flare up of the Plantar Fasciitis but unable to come in because until end of the month. Please advise. ?

## 2022-07-16 ENCOUNTER — Other Ambulatory Visit (HOSPITAL_COMMUNITY): Payer: Self-pay

## 2022-07-16 MED ORDER — TADALAFIL (PAH) 20 MG PO TABS
20.0000 mg | ORAL_TABLET | Freq: Every day | ORAL | 5 refills | Status: AC | PRN
  Filled 2022-07-16: qty 30, 30d supply, fill #0
  Filled 2022-12-14: qty 30, 30d supply, fill #1

## 2022-07-16 MED ORDER — TADALAFIL 5 MG PO TABS
5.0000 mg | ORAL_TABLET | Freq: Every day | ORAL | 3 refills | Status: DC
  Filled 2022-07-16: qty 90, 90d supply, fill #0
  Filled 2022-07-16: qty 30, 30d supply, fill #0
  Filled 2022-12-14: qty 90, 90d supply, fill #1
  Filled 2023-03-11: qty 90, 90d supply, fill #2
  Filled 2023-06-19: qty 90, 90d supply, fill #3

## 2022-08-23 ENCOUNTER — Other Ambulatory Visit: Payer: Self-pay | Admitting: Podiatry

## 2022-08-23 ENCOUNTER — Telehealth: Payer: Self-pay

## 2022-08-23 NOTE — Progress Notes (Signed)
error 

## 2022-08-24 ENCOUNTER — Other Ambulatory Visit: Payer: Self-pay | Admitting: Podiatry

## 2022-08-24 MED ORDER — CICLOPIROX 8 % EX SOLN: Freq: Every day | CUTANEOUS | 2 refills | Status: AC

## 2022-08-24 NOTE — Telephone Encounter (Signed)
Patient wants to try the topical cream

## 2022-09-26 ENCOUNTER — Ambulatory Visit (INDEPENDENT_AMBULATORY_CARE_PROVIDER_SITE_OTHER): Payer: PPO

## 2022-09-26 ENCOUNTER — Ambulatory Visit: Payer: PPO | Admitting: Podiatry

## 2022-09-26 ENCOUNTER — Ambulatory Visit (INDEPENDENT_AMBULATORY_CARE_PROVIDER_SITE_OTHER): Payer: PPO | Admitting: Podiatry

## 2022-09-26 ENCOUNTER — Other Ambulatory Visit: Payer: PPO

## 2022-09-26 DIAGNOSIS — M21619 Bunion of unspecified foot: Secondary | ICD-10-CM | POA: Diagnosis not present

## 2022-09-26 DIAGNOSIS — M2041 Other hammer toe(s) (acquired), right foot: Secondary | ICD-10-CM

## 2022-09-26 DIAGNOSIS — M2042 Other hammer toe(s) (acquired), left foot: Secondary | ICD-10-CM

## 2022-09-26 DIAGNOSIS — M722 Plantar fascial fibromatosis: Secondary | ICD-10-CM

## 2022-09-26 DIAGNOSIS — E1149 Type 2 diabetes mellitus with other diabetic neurological complication: Secondary | ICD-10-CM | POA: Diagnosis not present

## 2022-09-26 DIAGNOSIS — B351 Tinea unguium: Secondary | ICD-10-CM

## 2022-09-26 DIAGNOSIS — M79671 Pain in right foot: Secondary | ICD-10-CM

## 2022-09-26 MED ORDER — TRIAMCINOLONE ACETONIDE 10 MG/ML IJ SUSP
10.0000 mg | Freq: Once | INTRAMUSCULAR | Status: AC
  Administered 2022-09-26: 10 mg

## 2022-09-26 NOTE — Progress Notes (Addendum)
Subjective: Chief Complaint  Patient presents with   Nail Problem    Pt states he was unable to pick up the fungus med that was sent due to insurance issues    Foot Pain    Pt states his neuropathy pain is getting worse in both feet , right is worse than left    67 year old male presents the office with above concerns.  He was not able to get the antifungal medication due to insurance issues.  I did complete the prior authorization but have not heard back after that.  Also asking for an injection on the right heel due to discomfort.  Also the neuropathy seems to be getting worse.  He is asked me measured for diabetic shoes, inserts today.  Objective: AAO x3, NAD DP/PT pulses palpable bilaterally, CRT less than 3 seconds Sensation decreased with Semmes Weinstein monofilament Nails are hypertrophic, dystrophic with yellow, brown discoloration.  No hyperpigmentation. Mild tenderness palpation on plantar aspect calcaneal insertion of plantar fascia.  There is no pain with lateral compression of calcaneus.  There is no edema, erythema.  No pain with Achilles tendon.  Hammertoes are present. Bunion present.  No pain with calf compression, swelling, warmth, erythema  Assessment: 67 year old male with plantar fascia is, neuropathy, onychomycosis  Plan: -All treatment options discussed with the patient including all alternatives, risks, complications.  -X-rays were obtained and reviewed bilaterally.  3 views were obtained.  Hammertoes are present.  Prior ORIF noted.  No evidence of acute fracture.  Calcaneal spurs present.  Bunion present. -Steroid injection performed the plantar fascia on the right side.  See procedure note below.  Continue stretching, icing as well as using good arch support. -Evidence of neuropathy discussed possibly increasing dose of Lyrica. -Follow-up on the [place].  Will try to get blood work from his primary care doctor to hopefully start Lamisil as well.  He is on this  previously and he did well with it.  He is interested in going back on this as well -Measured for diabetic shoes today. -Patient encouraged to call the office with any questions, concerns, change in symptoms.   Procedure: Injection Tendon/Ligament Discussed alternatives, risks, complications and verbal consent was obtained.  Location: Right plantar fascia at the glabrous junction; medial approach. Skin Prep: Alcohol. Injectate: 0.5cc 0.5% marcaine plain, 0.5 cc 2% lidocaine plain and, 1 cc kenalog 10. Disposition: Patient tolerated procedure well. Injection site dressed with a band-aid.  Post-injection care was discussed and return precautions discussed.    Trula Slade DPM  Appeal completed 10/19/2022 for terbinafine.

## 2022-09-26 NOTE — Progress Notes (Signed)
Patient presents to the office today for diabetic shoe and insole measuring.  Patient was measured with brannock device to determine size and width for 1 pair of extra depth shoes and foam casted for 3 pair of insoles.   Documentation of medical necessity will be sent to patient's treating diabetic doctor to verify and sign.   Patient's diabetic provider: Hubbard Robinson, PA  Shoes and insoles will be ordered at that time and patient will be notified for an appointment for fitting when they arrive.   Shoe size (per patient): 9.5 men's   Brannock measurement: 9.5 x-wide  Patient shoe selection-   Shoe choice:   [id number] [employer] size ordered: 9.5 x-wide men's

## 2022-09-26 NOTE — Patient Instructions (Signed)

## 2022-10-09 ENCOUNTER — Other Ambulatory Visit: Payer: Self-pay | Admitting: Podiatry

## 2022-10-09 ENCOUNTER — Telehealth: Payer: Self-pay | Admitting: Podiatry

## 2022-10-09 DIAGNOSIS — Z79899 Other long term (current) drug therapy: Secondary | ICD-10-CM

## 2022-10-09 MED ORDER — TERBINAFINE HCL 250 MG PO TABS: 250.0000 mg | ORAL_TABLET | Freq: Every day | ORAL | 0 refills | Status: AC

## 2022-10-09 NOTE — Telephone Encounter (Signed)
Patient is aware 

## 2022-10-09 NOTE — Progress Notes (Signed)
Reviewed blood work and liver function is WNL. Will start Lamisil and recheck blood work in 4-6 weeks.

## 2022-10-09 NOTE — Telephone Encounter (Signed)
Please let the patient know that I have reviewed his blood work and liver function is normal. I have sent 90 days of Lamisil to the pharmacy for him. I put in a order to get blood work done in 4-6 weeks. If he has any issues with the medication, stop taking it and let me know. Thanks.

## 2022-11-09 ENCOUNTER — Other Ambulatory Visit (HOSPITAL_COMMUNITY): Payer: Self-pay | Admitting: Cardiology

## 2022-11-09 DIAGNOSIS — I700 Atherosclerosis of aorta: Secondary | ICD-10-CM

## 2022-11-15 ENCOUNTER — Telehealth: Payer: Self-pay | Admitting: Podiatry

## 2022-11-15 NOTE — Telephone Encounter (Signed)
Spoke with Charles Wells regarding his diabetic shoe order.   I explained to him that we can't complete his order for diabetic shoes if his Medicare paperwork is not filled out by his physician.   Charles Wells said that his doctor is out with prostate cancer so there is no one to fill out the paperwork.   He told me to fax the paperwork back to the office and put it attention [place].  I am refaxing paper to his physician.

## 2022-11-15 NOTE — Telephone Encounter (Signed)
Pt is calling with concerns with his diabetic shoes. He states he has not had any issues in the past with his insurance covering the shoes. He states he was fitted two months ago and was told yesterday that the order has not been placed. He is very upset and wants to speak with someone about this.He feels that this is poor practice and never had an issue before. He is not sure who he spoke to yesterday but he was not pleased with the information and service he received.  Please advise.

## 2022-11-20 ENCOUNTER — Ambulatory Visit (HOSPITAL_COMMUNITY)
Admission: RE | Admit: 2022-11-20 | Discharge: 2022-11-20 | Disposition: A | Discharge status: Home / Self Care | Payer: PPO | Source: Ambulatory Visit | Attending: Cardiology | Admitting: Cardiology

## 2022-11-20 DIAGNOSIS — I700 Atherosclerosis of aorta: Secondary | ICD-10-CM

## 2022-11-29 ENCOUNTER — Telehealth: Payer: Self-pay | Admitting: Podiatry

## 2022-11-29 MED ORDER — TERBINAFINE HCL 250 MG PO TABS: 250.0000 mg | ORAL_TABLET | Freq: Every day | ORAL | 0 refills | Status: AC

## 2022-11-29 NOTE — Telephone Encounter (Signed)
Received notification insurance will only cover Lamisil for 84 tablets, prescription sent.

## 2022-12-14 ENCOUNTER — Other Ambulatory Visit: Payer: Self-pay

## 2022-12-14 ENCOUNTER — Other Ambulatory Visit (HOSPITAL_COMMUNITY): Payer: Self-pay

## 2022-12-20 ENCOUNTER — Other Ambulatory Visit (HOSPITAL_COMMUNITY): Payer: Self-pay

## 2023-02-07 ENCOUNTER — Telehealth: Payer: Self-pay | Admitting: Podiatry

## 2023-02-07 NOTE — Telephone Encounter (Signed)
Received authorization from [employer] for diabetic shoes and orthotics   Auth # [id number]   Will send to scan center to be scanned in

## 2023-02-11 ENCOUNTER — Telehealth: Payer: Self-pay

## 2023-02-11 ENCOUNTER — Ambulatory Visit (INDEPENDENT_AMBULATORY_CARE_PROVIDER_SITE_OTHER): Payer: PPO

## 2023-02-11 ENCOUNTER — Telehealth: Payer: Self-pay | Admitting: Podiatry

## 2023-02-11 ENCOUNTER — Ambulatory Visit (INDEPENDENT_AMBULATORY_CARE_PROVIDER_SITE_OTHER): Payer: PPO | Admitting: Podiatry

## 2023-02-11 DIAGNOSIS — M79675 Pain in left toe(s): Secondary | ICD-10-CM | POA: Diagnosis not present

## 2023-02-11 DIAGNOSIS — M79674 Pain in right toe(s): Secondary | ICD-10-CM

## 2023-02-11 DIAGNOSIS — S93602A Unspecified sprain of left foot, initial encounter: Secondary | ICD-10-CM | POA: Diagnosis not present

## 2023-02-11 DIAGNOSIS — B351 Tinea unguium: Secondary | ICD-10-CM

## 2023-02-11 NOTE — Telephone Encounter (Signed)
Order for bedside commode placed with [employer]. Patient notified.

## 2023-02-11 NOTE — Telephone Encounter (Signed)
Dr Ardelle Anton said maybe Charles Wells called pt.

## 2023-02-11 NOTE — Telephone Encounter (Signed)
Pt called stating he missed a call from our office and he was seen today and you were to be getting him a bed side commode and maybe it was about that. I did not see any telephone messages for pt. Told pt we would call back.

## 2023-02-11 NOTE — Progress Notes (Addendum)
Subjective: Chief Complaint  Patient presents with   Foot Pain    Left foot pain  Pt stated that he fell down some steps Thursday night    67 year old male presents the office with above concerns.  States that he fell last Thursday Night He Was Seen in the emergency room.  He was placed into a surgical shoe.  He was told he may have a small avulsion fracture off of the heel.  Most of his pain is on the midfoot he points to.  Also has some of the nails be trimmed particular big toes with acute ingrown he is not able to trim them himself and cause discomfort.  No swelling redness or drainage.  No open lesions.  Objective: AAO x3, NAD DP/PT pulses palpable bilaterally, CRT less than 3 seconds Nails are hypertrophic, dystrophic, brittle, discolored, elongated 10.  Incurvation of the hallux toenails bilaterally without signs of infection.  No surrounding redness or drainage. Tenderness nails 1-5 bilaterally. No open lesions or pre-ulcerative lesions are identified today. Tenderness palpation mostly on the Lisfranc joint on the left foot.  There is trace edema there is no erythema or warmth.  There is no pain to the ankle there is no significant pain to the calcaneus.  Achilles tendon appears to be intact.  Flexor, extensor tendons are intact. No pain with calf compression, swelling, warmth, erythema  Assessment: 67 year old male with Lisfranc sprain; symptomatic onychosis/ingrown toenails  Plan: -All treatment options discussed with the patient including all alternatives, risks, complications.  -X-rays obtained reviewed.  No definitive fracture noted today. -Clinically the tendons appear to be intact.  Given the area of concern I am going to immobilize in a cam boot.  This was dispensed today to help facilitate healing given the injury.  -Ice, elevation.  Is already on pain medication for other issues. -He is also requesting a bedside commode which will be ordered. Lambert Mody debrided nails x 10  any complications or bleeding in particular was able to debride the course of the hallux nails without any complications or bleeding.  Monitor for any signs or symptoms of infection. -Patient encouraged to call the office with any questions, concerns, change in symptoms.   Vivi Barrack DPM  Faxed [employer] authorization for boot

## 2023-02-11 NOTE — Addendum Note (Signed)
Addended by: Ovid Curd R on: 02/11/2023 02:07 PM   Modules accepted: Orders

## 2023-02-11 NOTE — Patient Instructions (Signed)
Walking Boot, Adult  A walking boot holds your foot or ankle in place after an injury or a medical procedure. This helps with healing and prevents further injury. It has a hard, rigid outer frame that limits movement and supports your foot and leg. The inner lining is a layer of padded material. Walking boots also have adjustable straps to secure them over the foot and leg. A walking boot may be prescribed if you can put weight (bear weight) on your injured foot. How much you can walk while wearing the boot will depend on the type and severity of your injury. How to put on a walking boot There are different types of walking boots. Each type has specific instructions about how to wear it properly. Follow instructions from your health care provider, such as: Ask someone to help you put on the boot, if needed. Sit to put on your boot. Doing this is more comfortable and helps to prevent falls. Open up the boot fully. Place your foot into the boot so your heel rests against the back. Your toes should be supported by the base of the boot. They should not hang over the front edge. Adjust the straps so the boot fits securely but is not too tight. Do not bend the hard frame of the boot to get a good fit. How to walk with a walking boot How much you can walk will depend on your injury. Some tips for managing with a boot include: Do not try to walk without wearing the boot unless your health care provider approves. Use other assistive walking devices, including crutches or canes, as told by your health care provider. On your uninjured foot, wear a shoe with a heel that is close to the height of the walking boot. Be careful when walking on surfaces that are uneven or wet. How to reduce swelling while using a walking boot  Rest your injured foot or leg as much as possible. If directed, put ice on the injured area. To do this: Put ice in a plastic bag. Place a towel between your skin and the bag. Leave the  ice on for 20 minutes, 2-3 times a day. Remove the ice if your skin turns bright red. This is very important. If you cannot feel pain, heat, or cold, you have a greater risk of damage to the area. Keep your injured foot or leg raised (elevated) above the level of your heart for at least 2?3 hours each day or as told by your health care provider. If swelling gets worse, loosen the boot. Rest and elevate your foot and leg. How to care for your skin and foot while using a walking boot Wear a long sock to protect your foot and leg from rubbing inside the boot. Take off the boot one time each day to check the injured area. Check your foot, the surrounding skin, and your leg to make sure there are no sores, rashes, swelling, or wounds. The skin should be a healthy color, not pale or blue. Try to notice if your walking pattern (gait) in the boot is fairly normal and that you are walking without a noticeable limp. Follow instructions from your health care provider about taking care of your incision or wound, if this applies. Clean and wash the injured area as told by your health care provider. Gently dry your foot and leg before putting the boot back on. Removing your walking boot Follow directions from your health care provider for removing the   walking boot. Generally, it is okay to remove your walking boot: When you are resting or sleeping. To clean your foot and leg. How to keep the walking boot clean Do not put any part of the boot in a washing machine or dryer. Do not use chemical cleaning products. These could irritate your skin, especially if you have a wound or an incision. Do not soak the liner of the boot. Use a washcloth with mild soap and water to clean the frame and the liner of the boot by hand. Allow the boot to air-dry completely before you put it back on your foot. Follow these instructions at home: Activity Your activity will be restricted depending on the type and severity of your  injury. Follow instructions from your health care provider. Also: Bathe and shower as told by your health care provider. Do not do any activities that could make your injury worse. Do not drive if your affected foot is the one that you use for driving. Contact a health care provider if: The boot is cracked or damaged. The boot does not fit properly. Your foot or leg hurts. You have a rash, sore, or open sore (ulcer) on your foot or leg. The skin on your foot or leg is pale. You have a wound or incision on the foot and it is getting worse. Your skin becomes painful, red, or irritated. Your swelling does not get better or it gets worse. Get help right away if: You have numbness in your foot or leg. The skin on your foot or leg is cold, blue, or gray. Summary A walking boot holds your foot or ankle in place after an injury or a medical procedure. There are different types of walking boots. Follow instructions about how to correctly wear your boot. Ask someone to help you put on the boot, if needed. It is important to check your skin and foot every day. Call your health care provider if you notice a rash or sore on your foot or leg. This information is not intended to replace advice given to you by your health care provider. Make sure you discuss any questions you have with your health care provider. Document Revised: 07/04/2020 Document Reviewed: 07/04/2020 [employer] Patient Education  2023 [employer]  

## 2023-02-14 ENCOUNTER — Telehealth: Payer: Self-pay | Admitting: Podiatry

## 2023-02-14 NOTE — Telephone Encounter (Signed)
Pt called stating that he received a bed side commode he wants the Rx to be sent to  [facility] in [place] on [address].  Pt also stated he need something for poison ivy the cream that was prescribe was too costly.    Please advise

## 2023-03-07 ENCOUNTER — Ambulatory Visit (INDEPENDENT_AMBULATORY_CARE_PROVIDER_SITE_OTHER): Payer: PPO

## 2023-03-07 ENCOUNTER — Ambulatory Visit (INDEPENDENT_AMBULATORY_CARE_PROVIDER_SITE_OTHER): Payer: PPO | Admitting: Podiatry

## 2023-03-07 DIAGNOSIS — S93602D Unspecified sprain of left foot, subsequent encounter: Secondary | ICD-10-CM

## 2023-03-07 DIAGNOSIS — S93602A Unspecified sprain of left foot, initial encounter: Secondary | ICD-10-CM

## 2023-03-07 NOTE — Progress Notes (Signed)
Subjective: Chief Complaint  Patient presents with   Follow-up    Foot sprain left foot. C/o soreness to his left foot. Denies any sharp pain at this time. Patient wears compression socks and elevates his foot.    67 year old male presents with above concerns.  He says overall he is doing better but just an aching sensation.  He also believes that the extra weight he is put on has not helped his feet.  No new injuries or concerns.  Presents today wearing a regular supportive shoe.    Objective: AAO x3, NAD DP/PT pulses palpable bilaterally, CRT less than 3 seconds Still some slight tenderness palpation the dorsal aspect midfoot Lisfranc joint left foot there is no significant edema.  There is no area pinpoint tenderness.  Flexor, extensor tendons appear to be intact.  MMT 5/5. No pain with calf compression, swelling, warmth, erythema  Assessment: Lisfranc sprain left   Plan: -All treatment options discussed with the patient including all alternatives, risks, complications.  -X-rays obtained reviewed of the left foot.  3 views of the foot were obtained.  No evidence of acute fracture and the Lisfranc appears in adequate alignment.  -Would recommend continue immobilization.  Continue ice, elevate as well as compression of any residual edema. -Patient encouraged to call the office with any questions, concerns, change in symptoms.   Return in about 6 weeks (around 04/18/2023) for nail trim, left foot injury (x-ray).  Vivi Barrack DPM

## 2023-03-11 ENCOUNTER — Other Ambulatory Visit (HOSPITAL_COMMUNITY): Payer: Self-pay

## 2023-04-18 ENCOUNTER — Ambulatory Visit: Payer: PPO | Admitting: Podiatry

## 2023-06-19 ENCOUNTER — Other Ambulatory Visit (HOSPITAL_COMMUNITY): Payer: Self-pay

## 2023-06-21 ENCOUNTER — Other Ambulatory Visit (HOSPITAL_COMMUNITY): Payer: Self-pay

## 2023-06-21 MED ORDER — TAMSULOSIN HCL 0.4 MG PO CAPS
0.4000 mg | ORAL_CAPSULE | Freq: Every day | ORAL | 3 refills | Status: AC
  Filled 2023-06-21: qty 90, 90d supply, fill #0
  Filled 2023-08-28: qty 90, 90d supply, fill #1
  Filled 2023-12-23: qty 90, 90d supply, fill #2

## 2023-06-25 ENCOUNTER — Other Ambulatory Visit (HOSPITAL_COMMUNITY): Payer: Self-pay

## 2023-06-28 ENCOUNTER — Other Ambulatory Visit (HOSPITAL_COMMUNITY): Payer: Self-pay

## 2023-07-01 ENCOUNTER — Other Ambulatory Visit (HOSPITAL_COMMUNITY): Payer: Self-pay

## 2023-07-03 ENCOUNTER — Telehealth: Payer: Self-pay | Admitting: Podiatry

## 2023-07-03 NOTE — Telephone Encounter (Signed)
PT called and said he is seeing a new doctor and would like the diabetic shoe paperwork sent to them. His Dr Is Suzzanne Cloud. The phone number is [phone]. Pt said fax number is [phone] and to call to confirm. The office said the fax number was [phone] when I called. I was going to do the paperwork but shoes were not available in pts size.

## 2023-07-31 ENCOUNTER — Other Ambulatory Visit (HOSPITAL_COMMUNITY): Payer: Self-pay

## 2023-07-31 MED ORDER — TADALAFIL 5 MG PO TABS
5.0000 mg | ORAL_TABLET | Freq: Every day | ORAL | 3 refills | Status: AC
  Filled 2023-07-31 – 2023-08-28 (×2): qty 90, 90d supply, fill #0
  Filled 2023-12-23: qty 30, 30d supply, fill #1
  Filled 2023-12-23: qty 90, 90d supply, fill #1

## 2023-07-31 MED ORDER — TADALAFIL (PAH) 20 MG PO TABS
20.0000 mg | ORAL_TABLET | Freq: Every day | ORAL | 5 refills | Status: AC | PRN
  Filled 2023-07-31 – 2023-08-28 (×2): qty 30, 30d supply, fill #0

## 2023-08-28 ENCOUNTER — Other Ambulatory Visit (HOSPITAL_COMMUNITY): Payer: Self-pay

## 2023-08-29 ENCOUNTER — Other Ambulatory Visit (HOSPITAL_COMMUNITY): Payer: Self-pay

## 2023-12-23 ENCOUNTER — Other Ambulatory Visit (HOSPITAL_COMMUNITY): Payer: Self-pay

## 2023-12-25 ENCOUNTER — Other Ambulatory Visit (HOSPITAL_COMMUNITY): Payer: Self-pay

## 2023-12-25 MED ORDER — WEGOVY 0.25 MG/0.5ML ~~LOC~~ SOAJ
0.2500 mg | SUBCUTANEOUS | 3 refills | Status: AC
  Filled 2023-12-25 – 2024-01-20 (×2): qty 2, 28d supply, fill #0

## 2023-12-26 ENCOUNTER — Other Ambulatory Visit (HOSPITAL_COMMUNITY): Payer: Self-pay

## 2023-12-26 MED ORDER — WEGOVY 0.25 MG/0.5ML ~~LOC~~ SOAJ
0.2500 mg | SUBCUTANEOUS | 0 refills | Status: AC
  Filled 2023-12-26: qty 2, 28d supply, fill #0

## 2024-01-02 ENCOUNTER — Other Ambulatory Visit (HOSPITAL_COMMUNITY): Payer: Self-pay

## 2024-01-06 ENCOUNTER — Other Ambulatory Visit (HOSPITAL_COMMUNITY): Payer: Self-pay

## 2024-01-20 ENCOUNTER — Other Ambulatory Visit (HOSPITAL_COMMUNITY): Payer: Self-pay

## 2024-01-27 ENCOUNTER — Other Ambulatory Visit (HOSPITAL_COMMUNITY): Payer: Self-pay

## 2024-01-27 MED ORDER — TADALAFIL 5 MG PO TABS
5.0000 mg | ORAL_TABLET | Freq: Every day | ORAL | 3 refills | Status: AC
  Filled 2024-01-27 – 2024-03-17 (×2): qty 90, 90d supply, fill #0
  Filled 2024-06-18: qty 90, 90d supply, fill #1
  Filled 2024-08-27 (×3): qty 90, 90d supply, fill #2

## 2024-01-27 MED ORDER — TAMSULOSIN HCL 0.4 MG PO CAPS
0.4000 mg | ORAL_CAPSULE | Freq: Every day | ORAL | 3 refills | Status: AC
  Filled 2024-01-27 – 2024-03-17 (×2): qty 90, 90d supply, fill #0
  Filled 2024-06-18: qty 90, 90d supply, fill #1
  Filled 2024-08-27: qty 90, 90d supply, fill #2

## 2024-02-06 ENCOUNTER — Other Ambulatory Visit (HOSPITAL_COMMUNITY): Payer: Self-pay

## 2024-02-07 ENCOUNTER — Other Ambulatory Visit (HOSPITAL_COMMUNITY): Payer: Self-pay

## 2024-02-10 ENCOUNTER — Other Ambulatory Visit (HOSPITAL_COMMUNITY): Payer: Self-pay

## 2024-02-28 ENCOUNTER — Other Ambulatory Visit (HOSPITAL_COMMUNITY): Payer: Self-pay

## 2024-03-16 ENCOUNTER — Other Ambulatory Visit (HOSPITAL_COMMUNITY): Payer: Self-pay

## 2024-03-16 MED ORDER — WEGOVY 0.5 MG/0.5ML ~~LOC~~ SOAJ
0.5000 mg | SUBCUTANEOUS | 5 refills | Status: AC
  Filled 2024-03-16: qty 2, 28d supply, fill #0

## 2024-03-17 ENCOUNTER — Other Ambulatory Visit: Payer: Self-pay

## 2024-03-17 ENCOUNTER — Other Ambulatory Visit (HOSPITAL_COMMUNITY): Payer: Self-pay

## 2024-03-23 ENCOUNTER — Other Ambulatory Visit (HOSPITAL_COMMUNITY): Payer: Self-pay

## 2024-03-23 MED ORDER — WEGOVY 1 MG/0.5ML ~~LOC~~ SOAJ
1.0000 mg | SUBCUTANEOUS | 1 refills | Status: AC
  Filled 2024-03-23 – 2024-04-03 (×2): qty 2, 28d supply, fill #0
  Filled 2024-05-05: qty 2, 28d supply, fill #1

## 2024-04-01 ENCOUNTER — Other Ambulatory Visit (HOSPITAL_COMMUNITY): Payer: Self-pay

## 2024-04-03 ENCOUNTER — Other Ambulatory Visit (HOSPITAL_COMMUNITY): Payer: Self-pay

## 2024-04-03 MED ORDER — ZOSTER VAC RECOMB ADJUVANTED 50 MCG/0.5ML IM SUSR
0.5000 mL | Freq: Once | INTRAMUSCULAR | 1 refills | Status: AC
  Filled 2024-04-03: qty 0.5, 1d supply, fill #0
  Filled 2024-06-12: qty 0.5, 1d supply, fill #1

## 2024-05-05 ENCOUNTER — Other Ambulatory Visit (HOSPITAL_COMMUNITY): Payer: Self-pay

## 2024-06-04 ENCOUNTER — Other Ambulatory Visit (HOSPITAL_COMMUNITY): Payer: Self-pay

## 2024-06-04 ENCOUNTER — Other Ambulatory Visit (HOSPITAL_BASED_OUTPATIENT_CLINIC_OR_DEPARTMENT_OTHER): Payer: Self-pay

## 2024-06-04 MED ORDER — WEGOVY 1.7 MG/0.75ML ~~LOC~~ SOAJ
1.7000 mg | SUBCUTANEOUS | 1 refills | Status: AC
  Filled 2024-06-04: qty 3, 28d supply, fill #0
  Filled 2024-06-22: qty 3, 28d supply, fill #1

## 2024-06-12 ENCOUNTER — Other Ambulatory Visit (HOSPITAL_COMMUNITY): Payer: Self-pay

## 2024-06-18 ENCOUNTER — Other Ambulatory Visit (HOSPITAL_COMMUNITY): Payer: Self-pay

## 2024-06-18 MED ORDER — FLUTICASONE-SALMETEROL 250-50 MCG/ACT IN AEPB
1.0000 | INHALATION_SPRAY | Freq: Two times a day (BID) | RESPIRATORY_TRACT | 2 refills | Status: AC
  Filled 2024-06-18: qty 180, 90d supply, fill #0

## 2024-06-22 ENCOUNTER — Other Ambulatory Visit (HOSPITAL_COMMUNITY): Payer: Self-pay

## 2024-06-22 MED ORDER — WEGOVY 0.5 MG/0.5ML ~~LOC~~ SOAJ
0.5000 mg | SUBCUTANEOUS | 6 refills | Status: AC
  Filled 2024-06-22: qty 4, 56d supply, fill #0

## 2024-06-25 ENCOUNTER — Other Ambulatory Visit (HOSPITAL_COMMUNITY): Payer: Self-pay

## 2024-06-25 MED ORDER — WEGOVY 2.4 MG/0.75ML ~~LOC~~ SOAJ
2.4000 mg | SUBCUTANEOUS | 6 refills | Status: AC
  Filled 2024-06-25: qty 3, 28d supply, fill #0
  Filled 2024-07-31: qty 3, 28d supply, fill #1
  Filled 2024-08-27: qty 3, 28d supply, fill #2
  Filled 2024-09-23: qty 3, 28d supply, fill #3
  Filled 2024-10-22 – 2024-10-29 (×2): qty 3, 28d supply, fill #4

## 2024-07-07 ENCOUNTER — Other Ambulatory Visit (HOSPITAL_COMMUNITY): Payer: Self-pay

## 2024-07-31 ENCOUNTER — Other Ambulatory Visit (HOSPITAL_COMMUNITY): Payer: Self-pay

## 2024-08-06 ENCOUNTER — Other Ambulatory Visit (HOSPITAL_COMMUNITY): Payer: Self-pay

## 2024-08-11 ENCOUNTER — Other Ambulatory Visit (HOSPITAL_COMMUNITY): Payer: Self-pay

## 2024-08-27 ENCOUNTER — Other Ambulatory Visit (HOSPITAL_COMMUNITY): Payer: Self-pay

## 2024-08-27 ENCOUNTER — Other Ambulatory Visit: Payer: Self-pay

## 2024-09-30 ENCOUNTER — Other Ambulatory Visit (HOSPITAL_COMMUNITY): Payer: Self-pay

## 2024-10-22 ENCOUNTER — Other Ambulatory Visit (HOSPITAL_COMMUNITY): Payer: Self-pay

## 2024-10-29 ENCOUNTER — Other Ambulatory Visit (HOSPITAL_COMMUNITY): Payer: Self-pay
